# Patient Record
Sex: Female | Born: 1990
Health system: Southern US, Community
[De-identification: ages and names within clinical notes are randomized; demographics above are authoritative.]

## PROBLEM LIST (undated history)

## (undated) ENCOUNTER — Inpatient Hospital Stay (HOSPITAL_COMMUNITY): Payer: Self-pay

## (undated) DIAGNOSIS — E079 Disorder of thyroid, unspecified: Secondary | ICD-10-CM

## (undated) DIAGNOSIS — Z789 Other specified health status: Secondary | ICD-10-CM

## (undated) HISTORY — PX: NO PAST SURGERIES: SHX2092

## (undated) HISTORY — DX: Disorder of thyroid, unspecified: E07.9

---

## 2014-10-05 LAB — OB RESULTS CONSOLE GC/CHLAMYDIA
Chlamydia: NEGATIVE
Gonorrhea: NEGATIVE

## 2014-10-05 LAB — OB RESULTS CONSOLE RPR: RPR: NONREACTIVE

## 2014-10-05 LAB — OB RESULTS CONSOLE ABO/RH: RH Type: POSITIVE

## 2014-10-05 LAB — OB RESULTS CONSOLE ANTIBODY SCREEN: Antibody Screen: NEGATIVE

## 2014-10-05 LAB — OB RESULTS CONSOLE HIV ANTIBODY (ROUTINE TESTING): HIV: NONREACTIVE

## 2014-10-05 LAB — OB RESULTS CONSOLE RUBELLA ANTIBODY, IGM: Rubella: IMMUNE

## 2014-10-05 LAB — OB RESULTS CONSOLE HEPATITIS B SURFACE ANTIGEN: HEP B S AG: NEGATIVE

## 2014-11-03 ENCOUNTER — Inpatient Hospital Stay (HOSPITAL_COMMUNITY)
Admission: AD | Admit: 2014-11-03 | Payer: BC Managed Care – PPO | Source: Ambulatory Visit | Admitting: Obstetrics and Gynecology

## 2014-11-24 NOTE — L&D Delivery Note (Signed)
Delivery Note At 2:31 PM a viable female "Desiree Watson" was delivered via Vaginal, Spontaneous Delivery (Presentation: OA restituting to Left Occiput Anterior).  APGARS: 8, 9; weight pending.   Placenta status: Intact, Spontaneous.  Cord: 3 vessels with the following complications: None.  Cord pH: NA  Anesthesia: Local for repair Episiotomy: None Lacerations: 2nd degree Perineal Suture Repair: 3.0 vicryl and 3.0 SH Est. Blood Loss (mL): 234  Mom to postpartum.  Baby to Couplet care / Skin to Skin.  Mom plans to breast and bottlefeed.  Dr. Sallye OberKulwa present during pushing and for entire delivery.  Sherre ScarletWILLIAMS, KIMBERLY 04/15/2015, 3:25 PM   I was called in for concern of fetal status with pushing, with decelerations with pushing.  When I arrived baby at outlet,+ 3 station.  She proceeded to push and deliver spontaneously with good fetal tolerance of pushing.  Dr. Sallye OberKulwa.

## 2015-03-26 LAB — OB RESULTS CONSOLE GBS: GBS: NEGATIVE

## 2015-04-15 ENCOUNTER — Encounter (HOSPITAL_COMMUNITY): Payer: Self-pay

## 2015-04-15 ENCOUNTER — Inpatient Hospital Stay (HOSPITAL_COMMUNITY)
Admission: AD | Admit: 2015-04-15 | Discharge: 2015-04-17 | DRG: 774 | Disposition: A | Discharge status: Home / Self Care | Payer: BLUE CROSS/BLUE SHIELD | Source: Ambulatory Visit | Attending: Obstetrics & Gynecology | Admitting: Obstetrics & Gynecology

## 2015-04-15 DIAGNOSIS — O1493 Unspecified pre-eclampsia, third trimester: Principal | ICD-10-CM | POA: Diagnosis present

## 2015-04-15 DIAGNOSIS — O1213 Gestational proteinuria, third trimester: Secondary | ICD-10-CM | POA: Diagnosis present

## 2015-04-15 DIAGNOSIS — Z789 Other specified health status: Secondary | ICD-10-CM | POA: Diagnosis not present

## 2015-04-15 DIAGNOSIS — Z603 Acculturation difficulty: Secondary | ICD-10-CM | POA: Diagnosis not present

## 2015-04-15 DIAGNOSIS — R030 Elevated blood-pressure reading, without diagnosis of hypertension: Secondary | ICD-10-CM

## 2015-04-15 DIAGNOSIS — O1214 Gestational proteinuria, complicating childbirth: Secondary | ICD-10-CM | POA: Diagnosis present

## 2015-04-15 DIAGNOSIS — Z3403 Encounter for supervision of normal first pregnancy, third trimester: Secondary | ICD-10-CM | POA: Diagnosis present

## 2015-04-15 DIAGNOSIS — IMO0001 Reserved for inherently not codable concepts without codable children: Secondary | ICD-10-CM | POA: Diagnosis present

## 2015-04-15 DIAGNOSIS — Z3A38 38 weeks gestation of pregnancy: Secondary | ICD-10-CM | POA: Diagnosis present

## 2015-04-15 HISTORY — DX: Other specified health status: Z78.9

## 2015-04-15 LAB — URIC ACID: URIC ACID, SERUM: 5.4 mg/dL (ref 2.3–6.6)

## 2015-04-15 LAB — LACTATE DEHYDROGENASE: LDH: 169 U/L (ref 98–192)

## 2015-04-15 LAB — COMPREHENSIVE METABOLIC PANEL
ALT: 24 U/L (ref 14–54)
AST: 26 U/L (ref 15–41)
Albumin: 3.3 g/dL — ABNORMAL LOW (ref 3.5–5.0)
Alkaline Phosphatase: 210 U/L — ABNORMAL HIGH (ref 38–126)
Anion gap: 8 (ref 5–15)
BUN: 11 mg/dL (ref 6–20)
CALCIUM: 9.1 mg/dL (ref 8.9–10.3)
CO2: 25 mmol/L (ref 22–32)
CREATININE: 0.71 mg/dL (ref 0.44–1.00)
Chloride: 100 mmol/L — ABNORMAL LOW (ref 101–111)
GFR calc non Af Amer: >60 mL/min (ref >60)
Glucose, Bld: 80 mg/dL (ref 65–99)
Potassium: 4.3 mmol/L (ref 3.5–5.1)
Sodium: 133 mmol/L — ABNORMAL LOW (ref 135–145)
Total Bilirubin: 0.2 mg/dL — ABNORMAL LOW (ref 0.3–1.2)
Total Protein: 7.1 g/dL (ref 6.5–8.1)

## 2015-04-15 LAB — PROTEIN / CREATININE RATIO, URINE
CREATININE, URINE: 181 mg/dL
PROTEIN CREATININE RATIO: 0.55 mg/mg{creat} — AB (ref 0.00–0.15)
Total Protein, Urine: 100 mg/dL

## 2015-04-15 LAB — CBC
HEMATOCRIT: 40.7 % (ref 36.0–46.0)
Hemoglobin: 14.2 g/dL (ref 12.0–15.0)
MCH: 29 pg (ref 26.0–34.0)
MCHC: 34.9 g/dL (ref 30.0–36.0)
MCV: 83.1 fL (ref 78.0–100.0)
PLATELETS: 255 10*3/uL (ref 150–400)
RBC: 4.9 MIL/uL (ref 3.87–5.11)
RDW: 14.1 % (ref 11.5–15.5)
WBC: 11.9 10*3/uL — AB (ref 4.0–10.5)

## 2015-04-15 LAB — TYPE AND SCREEN
ABO/RH(D): B POS
ANTIBODY SCREEN: NEGATIVE

## 2015-04-15 LAB — ABO/RH: ABO/RH(D): B POS

## 2015-04-15 MED ORDER — LANOLIN HYDROUS EX OINT: TOPICAL_OINTMENT | CUTANEOUS | Status: DC | PRN

## 2015-04-15 MED ORDER — ONDANSETRON HCL 4 MG PO TABS: 4.0000 mg | ORAL_TABLET | ORAL | Status: DC | PRN

## 2015-04-15 MED ORDER — LACTATED RINGERS IV SOLN
500.0000 mL | INTRAVENOUS | Status: DC | PRN
  Administered 2015-04-15 (×2): 500 mL via INTRAVENOUS

## 2015-04-15 MED ORDER — BENZOCAINE-MENTHOL 20-0.5 % EX AERO
1.0000 "application " | INHALATION_SPRAY | CUTANEOUS | Status: DC | PRN
  Administered 2015-04-16 – 2015-04-17 (×2): 1 via TOPICAL
  Filled 2015-04-15 (×2): qty 56

## 2015-04-15 MED ORDER — FENTANYL 2.5 MCG/ML BUPIVACAINE 1/10 % EPIDURAL INFUSION (WH - ANES): 14.0000 mL/h | INTRAMUSCULAR | Status: DC | PRN

## 2015-04-15 MED ORDER — OXYTOCIN BOLUS FROM INFUSION
500.0000 mL | INTRAVENOUS | Status: DC
  Administered 2015-04-15: 500 mL via INTRAVENOUS

## 2015-04-15 MED ORDER — CITRIC ACID-SODIUM CITRATE 334-500 MG/5ML PO SOLN
30.0000 mL | ORAL | Status: DC | PRN
Start: 2015-04-15 — End: 2015-04-15

## 2015-04-15 MED ORDER — DIPHENHYDRAMINE HCL 25 MG PO CAPS: 25.0000 mg | ORAL_CAPSULE | Freq: Four times a day (QID) | ORAL | Status: DC | PRN

## 2015-04-15 MED ORDER — FLEET ENEMA 7-19 GM/118ML RE ENEM: 1.0000 | ENEMA | RECTAL | Status: DC | PRN

## 2015-04-15 MED ORDER — TERBUTALINE SULFATE 1 MG/ML IJ SOLN: 0.2500 mg | Freq: Once | INTRAMUSCULAR | Status: DC

## 2015-04-15 MED ORDER — ONDANSETRON HCL 4 MG/2ML IJ SOLN: 4.0000 mg | INTRAMUSCULAR | Status: DC | PRN

## 2015-04-15 MED ORDER — LIDOCAINE HCL (PF) 1 % IJ SOLN
30.0000 mL | INTRAMUSCULAR | Status: AC | PRN
  Administered 2015-04-15: 30 mL via SUBCUTANEOUS
  Filled 2015-04-15: qty 30

## 2015-04-15 MED ORDER — ACETAMINOPHEN 325 MG PO TABS: 650.0000 mg | ORAL_TABLET | ORAL | Status: DC | PRN

## 2015-04-15 MED ORDER — DIBUCAINE 1 % RE OINT: 1.0000 "application " | TOPICAL_OINTMENT | RECTAL | Status: DC | PRN

## 2015-04-15 MED ORDER — WITCH HAZEL-GLYCERIN EX PADS: 1.0000 "application " | MEDICATED_PAD | CUTANEOUS | Status: DC | PRN

## 2015-04-15 MED ORDER — SIMETHICONE 80 MG PO CHEW: 80.0000 mg | CHEWABLE_TABLET | ORAL | Status: DC | PRN

## 2015-04-15 MED ORDER — LACTATED RINGERS IV SOLN: INTRAVENOUS | Status: DC

## 2015-04-15 MED ORDER — PHENYLEPHRINE 40 MCG/ML (10ML) SYRINGE FOR IV PUSH (FOR BLOOD PRESSURE SUPPORT)
80.0000 ug | PREFILLED_SYRINGE | INTRAVENOUS | Status: DC | PRN
  Filled 2015-04-15: qty 2

## 2015-04-15 MED ORDER — OXYCODONE-ACETAMINOPHEN 5-325 MG PO TABS: 2.0000 | ORAL_TABLET | ORAL | Status: DC | PRN

## 2015-04-15 MED ORDER — HYDROXYZINE HCL 50 MG PO TABS: 50.0000 mg | ORAL_TABLET | Freq: Four times a day (QID) | ORAL | Status: DC | PRN

## 2015-04-15 MED ORDER — ZOLPIDEM TARTRATE 5 MG PO TABS: 5.0000 mg | ORAL_TABLET | Freq: Every evening | ORAL | Status: DC | PRN

## 2015-04-15 MED ORDER — EPHEDRINE 5 MG/ML INJ
10.0000 mg | INTRAVENOUS | Status: DC | PRN
  Filled 2015-04-15: qty 2

## 2015-04-15 MED ORDER — IBUPROFEN 600 MG PO TABS
600.0000 mg | ORAL_TABLET | Freq: Four times a day (QID) | ORAL | Status: DC
  Administered 2015-04-15 – 2015-04-17 (×8): 600 mg via ORAL
  Filled 2015-04-15 (×7): qty 1

## 2015-04-15 MED ORDER — OXYCODONE-ACETAMINOPHEN 5-325 MG PO TABS: 1.0000 | ORAL_TABLET | ORAL | Status: DC | PRN

## 2015-04-15 MED ORDER — TERBUTALINE SULFATE 1 MG/ML IJ SOLN
INTRAMUSCULAR | Status: AC
  Filled 2015-04-15: qty 1

## 2015-04-15 MED ORDER — OXYTOCIN 40 UNITS IN LACTATED RINGERS INFUSION - SIMPLE MED
62.5000 mL/h | INTRAVENOUS | Status: DC
  Filled 2015-04-15: qty 1000

## 2015-04-15 MED ORDER — ONDANSETRON HCL 4 MG/2ML IJ SOLN: 4.0000 mg | Freq: Four times a day (QID) | INTRAMUSCULAR | Status: DC | PRN

## 2015-04-15 MED ORDER — SENNOSIDES-DOCUSATE SODIUM 8.6-50 MG PO TABS
2.0000 | ORAL_TABLET | ORAL | Status: DC
  Administered 2015-04-15 – 2015-04-17 (×2): 2 via ORAL
  Filled 2015-04-15 (×2): qty 2

## 2015-04-15 MED ORDER — PRENATAL MULTIVITAMIN CH
1.0000 | ORAL_TABLET | Freq: Every day | ORAL | Status: DC
  Administered 2015-04-16: 1 via ORAL
  Filled 2015-04-15: qty 1

## 2015-04-15 MED ORDER — DIPHENHYDRAMINE HCL 50 MG/ML IJ SOLN: 12.5000 mg | INTRAMUSCULAR | Status: DC | PRN

## 2015-04-15 MED ORDER — TETANUS-DIPHTH-ACELL PERTUSSIS 5-2.5-18.5 LF-MCG/0.5 IM SUSP: 0.5000 mL | Freq: Once | INTRAMUSCULAR | Status: DC

## 2015-04-15 MED ORDER — NALBUPHINE HCL 10 MG/ML IJ SOLN
10.0000 mg | INTRAMUSCULAR | Status: DC | PRN
  Administered 2015-04-15: 10 mg via INTRAVENOUS
  Filled 2015-04-15: qty 1

## 2015-04-15 NOTE — Progress Notes (Signed)
Subjective: Denies h/a, visual disturbances, RUQ pain, CP, SOB, weakness or N/V. +FM. Involuntarily pushing.  Objective: BP 138/80 mmHg  Pulse 88  Temp(Src) 98 F (36.7 C) (Oral)  Resp 20  Ht 5' 1"  (1.549 m)  Wt 64.864 kg (143 lb)  BMI 27.03 kg/m2     Recent Results (from the past 2160 hour(s))  OB RESULT CONSOLE Group B Strep     Status: None   Collection Time: 03/26/15 12:00 AM  Result Value Ref Range   GBS Negative   CBC     Status: Abnormal   Collection Time: 04/15/15 10:50 AM  Result Value Ref Range   WBC 11.9 (H) 4.0 - 10.5 K/uL   RBC 4.90 3.87 - 5.11 MIL/uL   Hemoglobin 14.2 12.0 - 15.0 g/dL   HCT 40.7 36.0 - 46.0 %   MCV 83.1 78.0 - 100.0 fL   MCH 29.0 26.0 - 34.0 pg   MCHC 34.9 30.0 - 36.0 g/dL   RDW 14.1 11.5 - 15.5 %   Platelets 255 150 - 400 K/uL  Type and screen     Status: None   Collection Time: 04/15/15 10:50 AM  Result Value Ref Range   ABO/RH(D) B POS    Antibody Screen NEG    Sample Expiration 04/18/2015   Comprehensive metabolic panel     Status: Abnormal   Collection Time: 04/15/15 10:50 AM  Result Value Ref Range   Sodium 133 (L) 135 - 145 mmol/L   Potassium 4.3 3.5 - 5.1 mmol/L   Chloride 100 (L) 101 - 111 mmol/L   CO2 25 22 - 32 mmol/L   Glucose, Bld 80 65 - 99 mg/dL   BUN 11 6 - 20 mg/dL   Creatinine, Ser 0.71 0.44 - 1.00 mg/dL   Calcium 9.1 8.9 - 10.3 mg/dL   Total Protein 7.1 6.5 - 8.1 g/dL   Albumin 3.3 (L) 3.5 - 5.0 g/dL   AST 26 15 - 41 U/L   ALT 24 14 - 54 U/L   Alkaline Phosphatase 210 (H) 38 - 126 U/L   Total Bilirubin 0.2 (L) 0.3 - 1.2 mg/dL    Comment: REPEATED TO VERIFY   GFR calc non Af Amer >60 >60 mL/min   GFR calc Af Amer >60 >60 mL/min    Comment: (NOTE) The eGFR has been calculated using the CKD EPI equation. This calculation has not been validated in all clinical situations. eGFR's persistently <60 mL/min signify possible Chronic Kidney Disease.    Anion gap 8 5 - 15  Lactate dehydrogenase     Status: None   Collection Time: 04/15/15 10:50 AM  Result Value Ref Range   LDH 169 98 - 192 U/L  Uric acid     Status: None   Collection Time: 04/15/15 10:50 AM  Result Value Ref Range   Uric Acid, Serum 5.4 2.3 - 6.6 mg/dL  Protein / creatinine ratio, urine     Status: Abnormal   Collection Time: 04/15/15 12:50 PM  Result Value Ref Range   Creatinine, Urine 181.00 mg/dL   Total Protein, Urine 100 mg/dL    Comment: NO NORMAL RANGE ESTABLISHED FOR THIS TEST   Protein Creatinine Ratio 0.55 (H) 0.00 - 0.15 mg/mg[Cre]   FHT: Category 2  UC:   irregular, every 3-4 minutes SVE:   Dilation: 10 Effacement (%): 80 Station: +2 Exam by:: Nolita Kutter   Assessment:  IUP at 38.6 wks Preeclampsia w/o severe features 2nd stage labor  Plan: Consult  Farrel Gordon CNM 04/15/2015, 1:48 PM

## 2015-04-15 NOTE — Progress Notes (Signed)
Williams CNM on phone with her attending.

## 2015-04-15 NOTE — Progress Notes (Addendum)
FSE placed. Repetitive Variables, occ late with each push. Dr. Sallye OberKulwa notified and enroute to evaluate pt. Will cease pushing at this time. Discussed concerning FHRT with spouse and the need to hold off pushing until Dr. Sallye OberKulwa arrives. Spouse voiced understanding and relayed information to pt.  FHRT w/ moderate variability when pt is not pushing. Mod-severe variables w/ pushing. 02, position change and increase in IVFs in progress. Terb on stand-by.  Urine PCR .55; pt w/o s/s of preeclampsia. BPs range 123-146/80-88. Dr. Sallye OberKulwa made aware.   Sherre ScarletKimberly Tadeusz Stahl, CNM 04/15/15, 2:19 PM

## 2015-04-15 NOTE — Progress Notes (Signed)
  Subjective: Called to room to evaluate FHRT. S/P Nubain. Feels urge to push. Spouse at bedside serving as interpreter.  Objective: BP 142/88 mmHg  Pulse 78  Temp(Src) 98 F (36.7 C) (Oral)  Resp 16  Ht 5\' 1"  (1.549 m)  Wt 64.864 kg (143 lb)  BMI 27.03 kg/m2     Today's Vitals   04/15/15 1035 04/15/15 1119 04/15/15 1144 04/15/15 1212  BP: 142/88     Pulse: 78     Temp: 98 F (36.7 C)     TempSrc: Oral     Resp: 16     Height: 5\' 1"  (1.549 m)     Weight: 64.864 kg (143 lb)     PainSc: 6  Asleep Asleep Asleep   FHT: BL 130 w/ moderate variability, +accels, occ variables and lates to 60-90 w/ steady return to baseline, +earlys. UC:   irregular, every 2-3 minutes SVE: 8/80/0 - +1  BBOW - AROM'd, copious amount of clear fluid - cvx initially felt to be complete, but was able to feel (floppy) cvx on both sides. Trial push poor effort. Straight cath'd due to urinating during trial push. Sample taken for PCR.  Assessment:  IUP at 38.6 wks Active labor Cat 2 tracing that resolved to Cat 1 w/ intrauterine resuscitative measures  Plan: Continue intrauterine resuscitative measures prn Await full dilation before beginning active pushing Consult prn Expect SVD   Sherre ScarletWILLIAMS, Donyell Ding CNM 04/15/2015, 12:57 PM

## 2015-04-15 NOTE — Progress Notes (Signed)
Desiree Watson, CNM notified of pt in MAU for labor eval. RN to check pt's cervix and cb with results.

## 2015-04-15 NOTE — H&P (Signed)
Desiree Watson is a 24 y.o. female, G1P0 at 38.6 weeks, presenting for ctxs, q 6 min x 1 hour. +FM and bloody show. Denies headache, visual disturbances, RUQ pain, CP, SOB, weakness, N/V or leaking.   Patient Active Problem List   Diagnosis Date Noted  . Normal labor 04/15/2015  . Language barrier 04/15/2015  . Elevated BP 04/15/2015    History of present pregnancy: Patient entered care at 14.3 weeks.   EDC of 04/23/15 was established by sure LMP.   Anatomy scan: 18.0 weeks, with ACI and fetal spine not seen and an anterior placenta.   Additional US evaluations: None.   Significant prenatal events: Urinary frequency in 2nd trimester - neg urine culture; vaginal spotting around 22 wks - ceased on own; gas pain in 3rd trimester - resolved on own; abdominal pain when standing at work - common discomforts of pregnancy reviewed, belly band for support suggested, PTL precautions given. BP 130/90 at 34 wks w/ repeat = 110/80. No MAU visits. TWG 18 lbs.  Last evaluation: Office on 04/09/15 @ 38 wks by Dr. Stefano GaulStringer. FHR 150 bpm. Cvx 2/50/-3. BP 100/60.   OB History    Gravida Para Term Preterm AB TAB SAB Ectopic Multiple Living   1              Past Medical History  Diagnosis Date  . Medical history non-contributory    Past Surgical History  Procedure Laterality Date  . No past surgeries     Family History: family history is not on file. Social History:  reports that she has never smoked. She has never used smokeless tobacco. She reports that she does not drink alcohol or use illicit drugs.Pt is an Asian [place] female with a 12th grade education, and married to FlorenceLin K. She speaks a little [place] - spouse serves as Equities traderinterpreter. 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   Prenatal Transfer Tool  Maternal Diabetes: No Genetic Screening: Normal Maternal Ultrasounds/Referrals: Normal Fetal Ultrasounds or other Referrals:  None Maternal Substance Abuse:  No Significant Maternal  Medications:  Meds include: Other: PNVs Significant Maternal Lab Results: Lab values include: Group B Strep negative  TDAP: 01/15/15 Flu: Does not recall date  ROS: As noted in HPI  No Known Allergies    Blood pressure 142/88, pulse 78, temperature 98 F (36.7 C), temperature source Oral, resp. rate 16, height 5\' 1"  (1.549 m), weight 64.864 kg (143 lb).  Chest clear Heart RRR without murmur Abd gravid, NT, FH S<D Pelvic: 6/80/-2 w/ BBOW per MAU RN Cephalic by Leopolds and VE EFW: 6+4 Bishop score: 9 Ext: WNL  FHR: Category 1 UCs: q 5-7 min  Prenatal labs: ABO, Rh: --/--/B POS (05/22 1050) Antibody: PENDING (05/22 1050) Neg 10/05/14 Rubella:   Immune RPR: Nonreactive (11/12 0000)  HBsAg: Negative (11/12 0000)  HIV: Non-reactive (11/12 0000)  GBS: Negative (05/02 0000) Sickle cell/Hgb electrophoresis: NA Pap: Normal on 10/27/15 GC: 10/05/14 Chlamydia: 10/05/14 Genetic screenings: Neg Glucola: Normal at 59 Hgb 13.1 at NOB, 12.5 at 28 weeks  Results for orders placed or performed during the hospital encounter of 04/15/15 (from the past 24 hour(s))  CBC     Status: Abnormal   Collection Time: 04/15/15 10:50 AM  Result Value Ref Range   WBC 11.9 (H) 4.0 - 10.5 K/uL   RBC 4.90 3.87 - 5.11 MIL/uL   Hemoglobin 14.2 12.0 - 15.0 g/dL   HCT 11.940.7 14.736.0 - 82.946.0 %   MCV 83.1 78.0 -  100.0 fL   MCH 29.0 26.0 - 34.0 pg   MCHC 34.9 30.0 - 36.0 g/dL   RDW 16.1 09.6 - 04.5 %   Platelets 255 150 - 400 K/uL  Type and screen     Status: None (Preliminary result)   Collection Time: 04/15/15 10:50 AM  Result Value Ref Range   ABO/RH(D) B POS    Antibody Screen PENDING    Sample Expiration 04/18/2015   Comprehensive metabolic panel     Status: Abnormal   Collection Time: 04/15/15 10:50 AM  Result Value Ref Range   Sodium 133 (L) 135 - 145 mmol/L   Potassium 4.3 3.5 - 5.1 mmol/L   Chloride 100 (L) 101 - 111 mmol/L   CO2 25 22 - 32 mmol/L   Glucose, Bld 80 65 - 99 mg/dL   BUN 11  6 - 20 mg/dL   Creatinine, Ser 4.09 0.44 - 1.00 mg/dL   Calcium 9.1 8.9 - 81.1 mg/dL   Total Protein 7.1 6.5 - 8.1 g/dL   Albumin 3.3 (L) 3.5 - 5.0 g/dL   AST 26 15 - 41 U/L   ALT 24 14 - 54 U/L   Alkaline Phosphatase 210 (H) 38 - 126 U/L   Total Bilirubin 0.2 (L) 0.3 - 1.2 mg/dL   GFR calc non Af Amer >60 >60 mL/min   GFR calc Af Amer >60 >60 mL/min   Anion gap 8 5 - 15  Lactate dehydrogenase     Status: None   Collection Time: 04/15/15 10:50 AM  Result Value Ref Range   LDH 169 98 - 192 U/L  Uric acid     Status: None   Collection Time: 04/15/15 10:50 AM  Result Value Ref Range   Uric Acid, Serum 5.4 2.3 - 6.6 mg/dL   Assessment: IUP at 91.4 wks Active labor GBS neg Elevated BP on admission - r/o preeclampsia  Plan: Admit to [facility] [employer] Routine [facility] orders Preeclampsia labs to include PCR w/ admission labs Pain med/epidural prn Expectant management for now Expect progress and SVD   Robyne Askew, MS 04/15/2015, 12:08 PM

## 2015-04-15 NOTE — MAU Note (Signed)
Pt to go to room 167 via WC

## 2015-04-15 NOTE — MAU Note (Signed)
Here for contractions q6 minutes apart, with bloody show.

## 2015-04-15 NOTE — Lactation Note (Signed)
This note was copied from the chart of Desiree Watson. Lactation Consultation Note  Patient Name: Desiree Watson JXBJY'NToday's Date: 04/15/2015 Reason for consult: Initial assessment Baby 5 hours of life. Mom resting in bed and baby wrapped in crib. FOB has signed as mom's interpreter. Enc mom to hold baby STS and mom agreed. Demonstrated to FOB how to change a dirty diaper. Assisted mom with hand express, but only tiny amount of moisture present at breast. Discussed supply and demand with mom and the benefits of STS. Baby had a bottle a little earlier and is not cuing to nurse. FOB states baby will be having a bath soon. Enc mom to continue STS until bath, and then during STS after bath to attempt to latch. Enc parents to ask for help with latching at that time if baby cuing to nurse. Discussed feeding cues. Parents given LC brochure, aware of OP/BFSG, community resources, and [facility] after [facility].  Maternal Data Has patient been taught Hand Expression?: Yes Does the patient have breastfeeding experience prior to this delivery?: No  Feeding    LATCH Score/Interventions                      Lactation Tools Discussed/Used     Consult Status Consult Status: Follow-up Date: 04/16/15 Follow-up type: In-patient    Geralynn OchsWILLIARD, Berdie Malter 04/15/2015, 9:11 PM

## 2015-04-15 NOTE — Progress Notes (Signed)
KWilliams, CNM notified of pt's cervical exam, will place admission orders and [facility]

## 2015-04-16 LAB — RPR
RPR: NONREACTIVE
RPR: NONREACTIVE

## 2015-04-16 LAB — CBC
HEMATOCRIT: 37.9 % (ref 36.0–46.0)
Hemoglobin: 13.2 g/dL (ref 12.0–15.0)
MCH: 28.7 pg (ref 26.0–34.0)
MCHC: 34.8 g/dL (ref 30.0–36.0)
MCV: 82.4 fL (ref 78.0–100.0)
Platelets: 218 10*3/uL (ref 150–400)
RBC: 4.6 MIL/uL (ref 3.87–5.11)
RDW: 14.1 % (ref 11.5–15.5)
WBC: 13.4 10*3/uL — ABNORMAL HIGH (ref 4.0–10.5)

## 2015-04-16 NOTE — Lactation Note (Addendum)
This note was copied from the chart of Desiree Vivianne Masterhuy Bissette. Lactation Consultation Note  Patient Name: Desiree Watson ZOXWR'UToday's Date: 04/16/2015 Reason for consult: Follow-up assessment;Breast/nipple pain;Difficult latch FOB interprets for Mom. Parents report baby is not latching to right breast and Mom left nipple is now tender due to baby nursing on this side. The left nipple is red, demonstrated to Mom how to hand express and apply EBM to sore nipple. Mom's nipples are erect but with short nipple shafts and aerola edema present bilateral. Baby is noted to have short lingual frenulum. Mom is trying to latch baby in cradle hold on left breast but baby is not obtaining good depth. Assisted Mom is cross cradle and football hold. Baby latches in football hold with the most depth. Mom prefers cradle. Gave Mom hand pump and reviewed how to use/clean with parents. Encouraged to pre-pump to help with latch. Advised baby should be at the breast 8-12 times in 24 hours nursing 15-20 minutes, both breasts each feeding when possible. Encouraged Mom to change positions with nursing to help with tenderness. Advised if parents continue to supplement to BF with each feeding before giving any supplements. Supplemental guidelines reviewed with FOB. If baby does not latch to right breast, advised Mom needs to pump for 15 minutes to encourage milk production.  Encouraged to call for assist as needed.   Maternal Data    Feeding Feeding Type: Breast Fed Length of feed: 15 min  LATCH Score/Interventions Latch: Repeated attempts needed to sustain latch, nipple held in mouth throughout feeding, stimulation needed to elicit sucking reflex. Intervention(s): Adjust position;Assist with latch;Breast massage;Breast compression  Audible Swallowing: A few with stimulation  Type of Nipple: Everted at rest and after stimulation (short nipple shafts bilateral, aerola edema)  Comfort (Breast/Nipple): Filling, red/small blisters or  bruises, mild/mod discomfort  Problem noted: Mild/Moderate discomfort (left nipple) Interventions (Mild/moderate discomfort): Hand massage;Hand expression  Hold (Positioning): Assistance needed to correctly position infant at breast and maintain latch. Intervention(s): Breastfeeding basics reviewed;Support Pillows;Position options;Skin to skin  LATCH Score: 6  Lactation Tools Discussed/Used Tools: Pump Breast pump type: Manual   Consult Status Consult Status: Follow-up Date: 04/17/15 Follow-up type: In-patient    Alfred LevinsGranger, Latravion Graves Ann 04/16/2015, 4:21 PM

## 2015-04-16 NOTE — Progress Notes (Signed)
Post Partum Day 1 Subjective: no complaints, up ad lib and tolerating PO  Objective: Blood pressure 114/76, pulse 74, temperature 98.6 F (37 C), temperature source Oral, resp. rate 18, height 5\' 1"  (1.549 m), weight 143 lb (64.864 kg), unknown if currently breastfeeding.  Physical Exam:  General: alert and cooperative Lochia: appropriate Uterine Fundus: firm Incision: na DVT Evaluation: No evidence of DVT seen on physical exam.   Recent Labs  04/15/15 1050 04/16/15 0545  HGB 14.2 13.2  HCT 40.7 37.9    Assessment/Plan: Plan for discharge tomorrow   LOS: 1 day   Jaloni Sorber A 04/16/2015, 11:03 AM

## 2015-04-17 MED ORDER — IBUPROFEN 600 MG PO TABS: 600.0000 mg | ORAL_TABLET | Freq: Four times a day (QID) | ORAL | Status: DC | PRN

## 2015-04-17 NOTE — Discharge Instructions (Signed)

## 2015-04-17 NOTE — Lactation Note (Signed)
This note was copied from the chart of Desiree Vivianne Masterhuy Ballman. Lactation Consultation  Baby latched easily on left breast. Sucks and some swallows observed with breast massage. Assisted mother with positioning for increased depth. Observed feeding on left for 15 min. Burped baby and mother latched baby on right breast.  Sucks and some swallows observed. Reviewed engorgement care and Baby & Me booklet pg 24 for monitoring voids/stools. Discussed using hand pump if baby has difficulty latching. Reminded parents to feed baby every 3 hours or sooner based on cues.  Patient Name: Desiree Watson ZOXWR'UToday's Date: 04/17/2015 Reason for consult: Follow-up assessment   Maternal Data    Feeding Feeding Type: Breast Fed  LATCH Score/Interventions Latch: Grasps breast easily, tongue down, lips flanged, rhythmical sucking. Intervention(s): Breast massage;Assist with latch;Adjust position  Audible Swallowing: A few with stimulation Intervention(s): Hand expression Intervention(s): Alternate breast massage  Type of Nipple: Everted at rest and after stimulation  Comfort (Breast/Nipple): Filling, red/small blisters or bruises, mild/mod discomfort  Problem noted: Mild/Moderate discomfort  Hold (Positioning): No assistance needed to correctly position infant at breast.  LATCH Score: 8  Lactation Tools Discussed/Used     Consult Status Consult Status: Complete    Hardie PulleyBerkelhammer, Ruth Boschen 04/17/2015, 9:50 AM

## 2015-04-17 NOTE — Discharge Summary (Signed)
  Vaginal Delivery Discharge Summary  Desiree Watson  DOB:    05/23/1991 MRN:    [id number] CSN:    [id number]  Date of admission:                  04/15/15  Date of discharge:                   04/17/15  Procedures this admission:   SVB, repair of 2nd degree perineal laceration  Date of Delivery: 04/17/15  Newborn Data:  Live born female  Birth Weight: 6 lb 5.8 oz (2885 g) APGAR: 8, 9  Home with mother. Name: Desiree Watson  History of Present Illness:  Desiree Watson is a 24 y.o. female, G1P1001, who presents at 1186w6d weeks gestation. [facility] She was admitted for onset of labor. Her pregnancy has been complicated by:  Patient Active Problem List   Diagnosis Date Noted  . Language barrier 04/15/2015  . Elevated BP 04/15/2015  . Proteinuria in pregnancy, delivered 04/15/2015  . Vaginal delivery 04/15/2015  . Second-degree perineal laceration, with delivery 04/15/2015     Hospital Course:  Admitted 5/34/16 in active labor, with mild elevation of BP. Negative GBS. Progressed quickly, with AROM. Utilized nothing for pain management.  Delivery was performed by Sherre ScarletKimberly Williams, CNM, without complication. Patient and baby tolerated the procedure without difficulty, with 2nd degree laceration noted. Infant status was stable and remained in room with mother.  Mother and infant then had an uncomplicated postpartum course, with breast and bottle feeding going well. Mom's physical exam was WNL, and she was discharged home in stable condition. Contraception plan was undecided at the time of d/c.  She received adequate benefit from po pain medications, only using Motrin, with benefit..   Feeding:  breast and bottle  Contraception:  Undecided  Hemoglobin Results:  CBC Latest Ref Rng 04/16/2015 04/15/2015  WBC 4.0 - 10.5 K/uL 13.4(H) 11.9(H)  Hemoglobin 12.0 - 15.0 g/dL 78.213.2 95.614.2   Hematocrit 36.0 - 46.0 % 37.9 40.7  Platelets 150 - 400 K/uL 218 255     Discharge Physical Exam:   General: alert Lochia: appropriate Uterine Fundus: firm Incision: healing well DVT Evaluation: No evidence of DVT seen on physical exam. Negative Homan's sign.  Intrapartum Procedures: spontaneous vaginal delivery Postpartum Procedures: none Complications-Operative and Postpartum: 2nd degree perineal laceration  Discharge Diagnoses: Term Pregnancy-delivered  Discharge Information:  Activity:           pelvic rest Diet:                routine Medications: Ibuprofen Condition:      stable Instructions:     Discharge to: home  Follow-up Information    [facility]. Schedule an appointment as soon as possible for a visit in 6 weeks.   Specialty:  Obstetrics and Gynecology   Why:  Call for any questions or concerns.   Contact information:   [address] [place] [phone]       Nigel BridgemanLATHAM, Maze Corniel CNM 04/17/2015 9:08 AM

## 2017-03-12 ENCOUNTER — Ambulatory Visit (INDEPENDENT_AMBULATORY_CARE_PROVIDER_SITE_OTHER): Payer: BLUE CROSS/BLUE SHIELD | Admitting: Physician Assistant

## 2017-03-12 VITALS — BP 133/83 | HR 82 | Temp 99.2°F | Resp 16 | Ht 61.0 in | Wt 121.8 lb

## 2017-03-12 DIAGNOSIS — E049 Nontoxic goiter, unspecified: Secondary | ICD-10-CM

## 2017-03-12 DIAGNOSIS — Z8639 Personal history of other endocrine, nutritional and metabolic disease: Secondary | ICD-10-CM | POA: Diagnosis not present

## 2017-03-12 NOTE — Patient Instructions (Addendum)
You will get a call to schedule an appt with Dr. Dwyane Dee.  I will call you with the results of today's lab work when it comes back.   Thank you for coming in today. I hope you feel we met your needs.  Feel free to call [facility] if you have any questions or further requests.  Please consider signing up for MyChart if you do not already have it, as this is a great way to communicate with me.  Best,  Whitney McVey, PA-C   IF you received an x-ray today, you will receive an invoice from [facility]. Please contact [facility] at [phone] with questions or concerns regarding your invoice.   IF you received labwork today, you will receive an invoice from [facility]. Please contact [facility] at [phone] with questions or concerns regarding your invoice.   Our billing staff will not be able to assist you with questions regarding bills from these companies.  You will be contacted with the lab results as soon as they are available. The fastest way to get your results is to activate your My Chart account. Instructions are located on the last page of this paperwork. If you have not heard from [place] regarding the results in 2 weeks, please contact this office.

## 2017-03-12 NOTE — Progress Notes (Signed)
   Desiree Watson  MRN: [id number] DOB: 13-Nov-1991  PCP: No PCP Per Patient  Subjective:  Pt is a 26 year old female who presents to clinic for thyroid problem. She speaks vietnamese. She is here today with her father-in-law who is interpreting for her today.  She was diagnosed with a thyroid problem in [place] in 2010. Her last appt was 2014.   No mediations since 2014. She cannot recall the name of the medication she was taking.   C/o increasing size of her thyroid over the past month.  Denies heat/cold intolerance, anxiousness, change in bowel habits, hair loss, skin changes.    Review of Systems  Constitutional: Negative for activity change, appetite change and unexpected weight change.  Cardiovascular: Negative for chest pain and palpitations.  Endocrine: Negative for cold intolerance and heat intolerance.  Skin: Negative.   Neurological: Negative for dizziness and light-headedness.    Patient Active Problem List   Diagnosis Date Noted  . Language barrier 04/15/2015  . Elevated BP 04/15/2015  . Proteinuria in pregnancy, delivered 04/15/2015  . Vaginal delivery 04/15/2015  . Second-degree perineal laceration, with delivery 04/15/2015    Current Outpatient Prescriptions on File Prior to Visit  Medication Sig Dispense Refill  . ibuprofen (ADVIL,MOTRIN) 600 MG tablet Take 1 tablet (600 mg total) by mouth every 6 (six) hours as needed. (Patient not taking: Reported on 03/12/2017) 30 tablet 2  . Prenatal Vit-Fe Fumarate-FA (PRENATAL MULTIVITAMIN) TABS tablet Take 1 tablet by mouth daily at 12 noon.     No current facility-administered medications on file prior to visit.     No Known Allergies   Objective:  BP 133/83   Pulse 82   Temp 99.2 F (37.3 C) (Oral)   Resp 16   Ht  (1.549 m)   Wt 121 lb 12.8 oz (55.2 kg)   SpO2 99%   BMI 23.01 kg/m   Physical Exam  Constitutional: She is oriented to person, place, and time and well-developed, well-nourished, and in no distress.  No distress.  Neck: Neck supple. Thyromegaly present. No thyroid mass present.  Cardiovascular: Normal rate, regular rhythm and normal heart sounds.   Neurological: She is alert and oriented to person, place, and time. GCS score is 15.  Skin: Skin is warm and dry.  Psychiatric: Mood, memory, affect and judgment normal.  Vitals reviewed.   Assessment and Plan :  1. History of thyroid disorder 2. Enlarged thyroid - Ambulatory referral to Endocrinology - Thyroid Panel With TSH - Labs are pending, will contact with results. Referral to endocrinology for enlarged thyroid.   Marco Collie, PA-C  Primary Care at [facility] 03/12/2017 11:53 AM

## 2017-03-13 ENCOUNTER — Encounter: Payer: Self-pay | Admitting: Physician Assistant

## 2017-03-13 LAB — THYROID PANEL WITH TSH
Free Thyroxine Index: 1.9 (ref 1.2–4.9)
T3 Uptake Ratio: 31 % (ref 24–39)
T4, Total: 6.2 ug/dL (ref 4.5–12.0)
TSH: 0.876 u[IU]/mL (ref 0.450–4.500)

## 2017-04-24 ENCOUNTER — Encounter: Payer: Self-pay | Admitting: Endocrinology

## 2017-04-24 ENCOUNTER — Ambulatory Visit (INDEPENDENT_AMBULATORY_CARE_PROVIDER_SITE_OTHER): Payer: BLUE CROSS/BLUE SHIELD | Admitting: Endocrinology

## 2017-04-24 VITALS — BP 124/76 | HR 86 | Ht 61.0 in | Wt 120.2 lb

## 2017-04-24 DIAGNOSIS — E049 Nontoxic goiter, unspecified: Secondary | ICD-10-CM

## 2017-04-24 NOTE — Progress Notes (Signed)
Patient ID: Desiree Watson, female   DOB: 05/20/1991, 26 y.o.   MRN: [id number]            Referring physician: Sebastian AcheMcVey, Elizabeth Whitney, PA   Reason for Appointment: Goiter, new consultation    History of Present Illness:    The patient's thyroid enlargement was first discovered in 2011 approximately, at that time she was having a sensation of something in her throat when swallowing She was apparently in [place] at that time and was seen by a local physician She does not know what diagnosis was made.  However she was given an unknown medication which she took for 3 years With this her local symptoms resolved and she was told to stop the medication after taking it for 3 years  She is now being seen in consultation as she thinks she has the same symptoms again for a couple of months  She has had no difficulty with swallowing, however she has a feeling of something stuck in her throat area when swallowing although it is not uncomfortable Does not feel like she has any choking sensation in her neck or pressure in any position or when lying down. She has not noticed any swelling in her neck  Lab Results  Component Value Date   TSH 0.876 03/12/2017      Allergies as of 04/24/2017   No Known Allergies     Medication List    as of 04/24/2017 11:59 PM   You have not been prescribed any medications.     Allergies: No Known Allergies  Past Medical History:  Diagnosis Date  . Medical history non-contributory   . Thyroid disease     Past Surgical History:  Procedure Laterality Date  . NO PAST SURGERIES      Family History  Problem Relation Age of Onset  . Thyroid disease Neg Hx   . Cancer Neg Hx   . Diabetes Neg Hx     Social History:  reports that she has never smoked. She has never used smokeless tobacco. She reports that she does not drink alcohol or use drugs.    Review of Systems  Constitutional: Negative for weight loss and weight gain.  HENT: Negative for  hoarseness.   Eyes: Negative for visual disturbance.  Cardiovascular: Negative for leg swelling.  Gastrointestinal: Negative for constipation and diarrhea.  Endocrine: Negative for menstrual changes and fatigue.  Skin: Negative for rash.  Neurological: Negative for tremors.      Examination:   BP 124/76   Pulse 86   Ht 5\' 1"  (1.549 m)   Wt 120 lb 3.2 oz (54.5 kg)   SpO2 99%   BMI 22.71 kg/m    General Appearance: pleasant,  Averagely built and nourished         Eyes: No abnormal prominence or eyelid swelling.          Neck: The thyroid is enlarged Bilaterally, somewhat more on the right side especially medially.  Thyroid is about 2-2.5 times normal on the right and about twice normal on the left There is no stridor. There is no lymphadenopathy.     Cardiovascular: Normal  heart sounds, no murmur Respiratory:  Lungs clear Neurological: REFLEXES: at biceps are normal.  Skin: no rash        Assessment/Plan:  Long-standing goiter, may be either related to Hashimoto's thyroiditis or simple goiter Recently has normal thyroid levels However she thinks she has a foreign body sensation when swallowing because of the thyroid  swelling and this had previously improved with most likely thyroid supplementation Her symptom is likely to be related to her goiter recurring  Will further evaluate the goiter with TPO antibody and if positive will likely have good response to the thyroid suppression If negative we will consider thyroid ultrasound  Consultation note has been sent to the referring PCP  [facility] 04/26/2017

## 2017-04-27 ENCOUNTER — Other Ambulatory Visit (INDEPENDENT_AMBULATORY_CARE_PROVIDER_SITE_OTHER): Payer: BLUE CROSS/BLUE SHIELD

## 2017-04-27 DIAGNOSIS — E049 Nontoxic goiter, unspecified: Secondary | ICD-10-CM

## 2017-04-27 LAB — T4, FREE: Free T4: 0.9 ng/dL (ref 0.60–1.60)

## 2017-04-27 LAB — TSH: TSH: 0.69 u[IU]/mL (ref 0.35–4.50)

## 2017-04-28 ENCOUNTER — Telehealth: Payer: Self-pay | Admitting: Endocrinology

## 2017-04-28 LAB — THYROID PEROXIDASE ANTIBODY: THYROID PEROXIDASE ANTIBODY: 18 [IU]/mL (ref 0–34)

## 2017-04-28 NOTE — Telephone Encounter (Signed)
Called patients father in law back and went over lab results with him.

## 2017-04-28 NOTE — Progress Notes (Signed)
Please call her father-in-law about antibody test being negative, recommend thyroid ultrasound if agreeable with them

## 2017-04-28 NOTE — Telephone Encounter (Signed)
Patient's father in law returning phone call about lab results. Please call back. Okay to leave a detailed message [phone].

## 2017-04-29 ENCOUNTER — Other Ambulatory Visit: Payer: Self-pay | Admitting: Endocrinology

## 2017-04-29 DIAGNOSIS — E049 Nontoxic goiter, unspecified: Secondary | ICD-10-CM

## 2017-05-06 ENCOUNTER — Ambulatory Visit
Admission: RE | Admit: 2017-05-06 | Discharge: 2017-05-06 | Disposition: A | Discharge status: Home / Self Care | Payer: BLUE CROSS/BLUE SHIELD | Source: Ambulatory Visit | Attending: Endocrinology | Admitting: Endocrinology

## 2017-05-06 DIAGNOSIS — E049 Nontoxic goiter, unspecified: Secondary | ICD-10-CM | POA: Diagnosis not present

## 2017-05-06 NOTE — Progress Notes (Signed)
Please call to let her father-in-law note that she has small benign looking nodules in the thyroid and no further evaluation needed

## 2017-06-22 ENCOUNTER — Other Ambulatory Visit (INDEPENDENT_AMBULATORY_CARE_PROVIDER_SITE_OTHER): Payer: BLUE CROSS/BLUE SHIELD

## 2017-06-22 ENCOUNTER — Other Ambulatory Visit: Payer: Self-pay | Admitting: Endocrinology

## 2017-06-22 DIAGNOSIS — E049 Nontoxic goiter, unspecified: Secondary | ICD-10-CM | POA: Diagnosis not present

## 2017-06-22 DIAGNOSIS — E0590 Thyrotoxicosis, unspecified without thyrotoxic crisis or storm: Secondary | ICD-10-CM

## 2017-06-22 LAB — TSH: TSH: 0.01 u[IU]/mL — AB (ref 0.35–4.50)

## 2017-06-22 LAB — T3, FREE: T3 FREE: 7.6 pg/mL — AB (ref 2.3–4.2)

## 2017-06-22 LAB — T4, FREE: FREE T4: 3.62 ng/dL — AB (ref 0.60–1.60)

## 2017-06-25 ENCOUNTER — Encounter: Payer: Self-pay | Admitting: Endocrinology

## 2017-06-25 ENCOUNTER — Ambulatory Visit (INDEPENDENT_AMBULATORY_CARE_PROVIDER_SITE_OTHER): Payer: BLUE CROSS/BLUE SHIELD | Admitting: Endocrinology

## 2017-06-25 VITALS — BP 118/82 | HR 101 | Ht 61.0 in | Wt 118.0 lb

## 2017-06-25 DIAGNOSIS — E0590 Thyrotoxicosis, unspecified without thyrotoxic crisis or storm: Secondary | ICD-10-CM | POA: Diagnosis not present

## 2017-06-25 DIAGNOSIS — O99281 Endocrine, nutritional and metabolic diseases complicating pregnancy, first trimester: Secondary | ICD-10-CM | POA: Diagnosis not present

## 2017-06-25 MED ORDER — PROPYLTHIOURACIL 50 MG PO TABS: 100.0000 mg | ORAL_TABLET | Freq: Three times a day (TID) | ORAL | 1 refills | Status: DC

## 2017-06-25 NOTE — Progress Notes (Signed)
Patient ID: Desiree Watson, female   DOB: 01/27/1991, 26 y.o.   MRN: [id number]              Reason for Appointment: Follow-up of thyroid    History of Present Illness:    History obtained on initial consultation in June: The patient's thyroid enlargement was first discovered in 2011 approximately, at that time she was having a sensation of something in her throat when swallowing She was apparently in [place] at that time and was seen by a local physician She does not know what diagnosis was made.  However she was given an unknown medication which she took for 3 years With this her local symptoms resolved and she was told to stop the medication after taking it for 3 years She has had no difficulty with swallowing, however she has a feeling of something stuck in her throat area when swallowing although it is not uncomfortable  RECENT history:  It is unclear why the patient made a follow-up appointment and she had findings of euthyroid goiter with insignificant nodules on initial evaluation and no evidence of Hashimoto's thyroiditis  However she is about [redacted] weeks pregnant, her last menstrual cycle was on June 22 She has noticed some swelling of her thyroid area  On questioning the patient also said that she is having more tiredness and some general weakness She does have some morning sickness with nausea and occasional vomiting but not everyday and not consistent She thinks her stools are somewhat loose Does not complain of any shakiness or palpitations She feels a little warm on the back of her neck only and not generalized heat intolerance or sweating Her weight is down 2 pounds which she has a normal appetite when she does not have nausea  Wt Readings from Last 3 Encounters:  06/25/17 118 lb (53.5 kg)  04/24/17 120 lb 3.2 oz (54.5 kg)  03/12/17 121 lb 12.8 oz (55.2 kg)    Lab Results  Component Value Date   FREET4 3.62 (H) 06/22/2017   FREET4 0.90 04/27/2017   TSH 0.01 (L)  06/22/2017   TSH 0.69 04/27/2017   TSH 0.876 03/12/2017      Allergies as of 06/25/2017   No Known Allergies     Medication List    as of 06/25/2017 10:49 AM   You have not been prescribed any medications.     Allergies: No Known Allergies  Past Medical History:  Diagnosis Date  . Medical history non-contributory   . Thyroid disease     Past Surgical History:  Procedure Laterality Date  . NO PAST SURGERIES      Family History  Problem Relation Age of Onset  . Thyroid disease Neg Hx   . Cancer Neg Hx   . Diabetes Neg Hx     Social History:  reports that she has never smoked. She has never used smokeless tobacco. She reports that she does not drink alcohol or use drugs.    Review of Systems  Respiratory: Negative for shortness of breath.   Cardiovascular: Negative for palpitations.  Endocrine: Positive for fatigue and heat intolerance.  Neurological: Negative for tremors.      Examination:   BP 118/82   Pulse (!) 101   Ht 5\' 1"  (1.549 m)   Wt 118 lb (53.5 kg)   SpO2 97%   BMI 22.30 kg/m    Eyes: No abnormal prominence or eyelid swelling.           Neck: The  thyroid is enlarged diffusely Right lobe is enlarged about 2-1/2 times normal, she also has a fairly prominent isthmus Left lobe enlarged about twice normal Appears to have mild thrill over the thyroid   Cardiovascular: Normal  heart sounds, no murmur Neurological: She has no significant tremor REFLEXES: at biceps are slightly brisk.  No peripheral edema  Assessment/Plan:  HYPERTHYROIDISM associated with early pregnancy Her thyroid levels have increased very significantly within 2 months coinciding with her pregnancy Currently however she does not appear to have any hyperemesis associated with her pregnancy She is symptomatic and has tachycardia and appears to have some enlargement of her thyroid compared to her previous visit Her communication was done through her father-in-law but not  clear if she is understanding all her questions  Most likely her fatigue and generalized weakness is related to her hyperthyroidism  Since her thyroid levels including free T3 are markedly increased she very likely has Graves' disease  Will confirm this with thyrotroponin receptor antibody Mean values will start her on a modest dose of 100 mg 3 times a day of PTU Discussed that control of hyperthyroidism is important and we can target her thyroid levels to the upper normal range We will try to use the minimum dose of PTU which should otherwise he will be safe during pregnancy in the first trimester She will follow-up in one month  Encourage her to establish care with her obstetrician as soon as possible  Courtland Coppa 06/25/2017  Counseling time on subjects discussed in assessment and plan sections is over 50% of today's 25 minute visit

## 2017-06-26 LAB — THYROTROPIN RECEPTOR AUTOABS: THYROTROPIN RECEPTOR AB: 0.65 IU/L (ref 0.00–1.75)

## 2017-07-06 DIAGNOSIS — Z3492 Encounter for supervision of normal pregnancy, unspecified, second trimester: Secondary | ICD-10-CM | POA: Diagnosis not present

## 2017-07-06 DIAGNOSIS — Z3A12 12 weeks gestation of pregnancy: Secondary | ICD-10-CM | POA: Diagnosis not present

## 2017-07-06 DIAGNOSIS — O3680X0 Pregnancy with inconclusive fetal viability, not applicable or unspecified: Secondary | ICD-10-CM | POA: Diagnosis not present

## 2017-07-06 DIAGNOSIS — O30049 Twin pregnancy, dichorionic/diamniotic, unspecified trimester: Secondary | ICD-10-CM | POA: Diagnosis not present

## 2017-07-06 DIAGNOSIS — N912 Amenorrhea, unspecified: Secondary | ICD-10-CM | POA: Diagnosis not present

## 2017-07-17 DIAGNOSIS — Z3400 Encounter for supervision of normal first pregnancy, unspecified trimester: Secondary | ICD-10-CM | POA: Diagnosis not present

## 2017-07-17 DIAGNOSIS — Z3491 Encounter for supervision of normal pregnancy, unspecified, first trimester: Secondary | ICD-10-CM | POA: Diagnosis not present

## 2017-07-17 DIAGNOSIS — Z3A11 11 weeks gestation of pregnancy: Secondary | ICD-10-CM | POA: Diagnosis not present

## 2017-07-17 LAB — OB RESULTS CONSOLE GC/CHLAMYDIA
CHLAMYDIA, DNA PROBE: NEGATIVE
GC PROBE AMP, GENITAL: NEGATIVE

## 2017-07-17 LAB — OB RESULTS CONSOLE HIV ANTIBODY (ROUTINE TESTING): HIV: NONREACTIVE

## 2017-07-17 LAB — OB RESULTS CONSOLE ABO/RH: RH Type: POSITIVE

## 2017-07-17 LAB — OB RESULTS CONSOLE RPR: RPR: NONREACTIVE

## 2017-07-17 LAB — OB RESULTS CONSOLE RUBELLA ANTIBODY, IGM: Rubella: IMMUNE

## 2017-07-17 LAB — OB RESULTS CONSOLE HEPATITIS B SURFACE ANTIGEN: Hepatitis B Surface Ag: NEGATIVE

## 2017-07-17 LAB — OB RESULTS CONSOLE ANTIBODY SCREEN: Antibody Screen: NEGATIVE

## 2017-07-30 DIAGNOSIS — O30002 Twin pregnancy, unspecified number of placenta and unspecified number of amniotic sacs, second trimester: Secondary | ICD-10-CM | POA: Diagnosis not present

## 2017-07-30 DIAGNOSIS — N912 Amenorrhea, unspecified: Secondary | ICD-10-CM | POA: Diagnosis not present

## 2017-07-30 DIAGNOSIS — Z124 Encounter for screening for malignant neoplasm of cervix: Secondary | ICD-10-CM | POA: Diagnosis not present

## 2017-07-30 DIAGNOSIS — Z3A15 15 weeks gestation of pregnancy: Secondary | ICD-10-CM | POA: Diagnosis not present

## 2017-07-30 DIAGNOSIS — O3680X0 Pregnancy with inconclusive fetal viability, not applicable or unspecified: Secondary | ICD-10-CM | POA: Diagnosis not present

## 2017-08-03 ENCOUNTER — Other Ambulatory Visit: Payer: BLUE CROSS/BLUE SHIELD

## 2017-08-03 ENCOUNTER — Other Ambulatory Visit (INDEPENDENT_AMBULATORY_CARE_PROVIDER_SITE_OTHER): Payer: BLUE CROSS/BLUE SHIELD

## 2017-08-03 DIAGNOSIS — E0590 Thyrotoxicosis, unspecified without thyrotoxic crisis or storm: Secondary | ICD-10-CM

## 2017-08-03 DIAGNOSIS — O99281 Endocrine, nutritional and metabolic diseases complicating pregnancy, first trimester: Secondary | ICD-10-CM | POA: Diagnosis not present

## 2017-08-03 LAB — T3, FREE: T3, Free: 3.1 pg/mL (ref 2.3–4.2)

## 2017-08-03 LAB — T4, FREE: FREE T4: 0.58 ng/dL — AB (ref 0.60–1.60)

## 2017-08-05 ENCOUNTER — Ambulatory Visit (INDEPENDENT_AMBULATORY_CARE_PROVIDER_SITE_OTHER): Payer: BLUE CROSS/BLUE SHIELD | Admitting: Endocrinology

## 2017-08-05 ENCOUNTER — Encounter: Payer: Self-pay | Admitting: Endocrinology

## 2017-08-05 VITALS — BP 112/74 | HR 98 | Ht 61.0 in | Wt 128.4 lb

## 2017-08-05 DIAGNOSIS — E069 Thyroiditis, unspecified: Secondary | ICD-10-CM

## 2017-08-05 NOTE — Patient Instructions (Signed)
Stop PTU ?

## 2017-08-05 NOTE — Progress Notes (Signed)
Patient ID: Desiree Watson, female   DOB: 11/13/1991, 26 y.o.   MRN: [id number]              Reason for Appointment: Follow-up of thyroid    History of Present Illness:    History obtained on initial consultation in June: The patient's thyroid enlargement was first discovered in 2011 approximately, at that time she was having a sensation of something in her throat when swallowing She was apparently in [place] at that time and was seen by a local physician She does not know what diagnosis was made.  However she was given an unknown medication which she took for 3 years With this her local symptoms resolved and she was told to stop the medication after taking it for 3 years She has had no difficulty with swallowing, however she has a feeling of something stuck in her throat area when swallowing although it is not uncomfortable  RECENT history:  After initial consultation in 6/18 she had findings of euthyroid goiter with insignificant nodules on initial evaluation and no evidence of Hashimoto's thyroiditis She was seen in early pregnancy in 8/18 for follow-up of her thyroid when she was complaining of a little more fatigue and general weakness but also was having morning sickness However she did not have hyperemesis She did not have any typical symptoms of hyperthyroidism with shakiness, palpitations, heat intolerance or significant weight loss  Because of her significant hyperthyroidism and free T4 of 3.6 she was started on PTU 50 mg 3 times a day Thyrotropin antibody was negative  More recently she does not feel any fatigue or weakness and overall is eating better, has gained 10 pounds She is reportedly about 4 months pregnant and is due to deliver in 12/2017  Wt Readings from Last 3 Encounters:  08/05/17 128 lb 6.4 oz (58.2 kg)  06/25/17 118 lb (53.5 kg)  04/24/17 120 lb 3.2 oz (54.5 kg)    Lab Results  Component Value Date   FREET4 0.58 (L) 08/03/2017   FREET4 3.62 (H) 06/22/2017   FREET4 0.90 04/27/2017   TSH 0.01 (L) 06/22/2017   TSH 0.69 04/27/2017   TSH 0.876 03/12/2017      Allergies as of 08/05/2017   No Known Allergies     Medication List       Accurate as of 08/05/17  9:50 AM. Always use your most recent med list.          Prenatal Vitamin 27-0.8 MG Tabs Take 1 tablet by mouth 2 (two) times daily.   propylthiouracil 50 MG tablet Commonly known as:  PTU Take 2 tablets (100 mg total) by mouth 3 (three) times daily.       Allergies: No Known Allergies  Past Medical History:  Diagnosis Date  . Medical history non-contributory   . Thyroid disease     Past Surgical History:  Procedure Laterality Date  . NO PAST SURGERIES      Family History  Problem Relation Age of Onset  . Thyroid disease Neg Hx   . Cancer Neg Hx   . Diabetes Neg Hx     Social History:  reports that she has never smoked. She has never used smokeless tobacco. She reports that she does not drink alcohol or use drugs.    Review of Systems  Respiratory: Negative for shortness of breath.       Examination:   BP 112/74   Pulse 98   Ht 5\' 1"  (1.549 m)   Wt  128 lb 6.4 oz (58.2 kg)   SpO2 99%   BMI 24.26 kg/m            The thyroid is enlarged Bilaterally Right lobe is enlarged about 2-1/2 times normal, slightly irregular surface she also has a  prominent isthmus Left lobe enlarged about twice normal  Neurological: She has no significant tremor REFLEXES: at biceps are normal Skin appears normal  Assessment/Plan:  HYPERTHYROIDISM associated with early pregnancy Although she was not very symptomatic and treated only with minimal doses of PTU her hyperthyroidism has resolved and free T4 is now mildly decreased Her goiter feels about the same as before She does not appear to have any symptoms suggestive of hypothyroidism  Since she possibly may have had a silent thyroiditis episode and now mildly hypothyroid Will stop her PTU and follow-up in another  month without any treatment  [facility] 08/05/2017

## 2017-09-03 DIAGNOSIS — Z3A20 20 weeks gestation of pregnancy: Secondary | ICD-10-CM | POA: Diagnosis not present

## 2017-09-03 DIAGNOSIS — Z23 Encounter for immunization: Secondary | ICD-10-CM | POA: Diagnosis not present

## 2017-09-03 DIAGNOSIS — R8271 Bacteriuria: Secondary | ICD-10-CM | POA: Diagnosis not present

## 2017-09-03 DIAGNOSIS — O30002 Twin pregnancy, unspecified number of placenta and unspecified number of amniotic sacs, second trimester: Secondary | ICD-10-CM | POA: Diagnosis not present

## 2017-09-03 DIAGNOSIS — Z3402 Encounter for supervision of normal first pregnancy, second trimester: Secondary | ICD-10-CM | POA: Diagnosis not present

## 2017-09-03 DIAGNOSIS — Z363 Encounter for antenatal screening for malformations: Secondary | ICD-10-CM | POA: Diagnosis not present

## 2017-09-15 ENCOUNTER — Encounter: Payer: Self-pay | Admitting: Endocrinology

## 2017-09-15 ENCOUNTER — Ambulatory Visit (INDEPENDENT_AMBULATORY_CARE_PROVIDER_SITE_OTHER): Payer: BLUE CROSS/BLUE SHIELD | Admitting: Endocrinology

## 2017-09-15 VITALS — BP 120/82 | HR 95 | Ht 61.0 in | Wt 138.6 lb

## 2017-09-15 DIAGNOSIS — E069 Thyroiditis, unspecified: Secondary | ICD-10-CM | POA: Diagnosis not present

## 2017-09-15 LAB — TSH: TSH: 0.51 u[IU]/mL (ref 0.35–4.50)

## 2017-09-15 LAB — T4, FREE: Free T4: 0.68 ng/dL (ref 0.60–1.60)

## 2017-09-15 NOTE — Progress Notes (Signed)
Patient ID: Desiree Watson, female   DOB: 1991/07/01, 26 y.o.   MRN: [id number]              Reason for Appointment: Follow-up of thyroid    History of Present Illness:    History obtained on initial consultation in June: The patient's thyroid enlargement was first discovered in 2011 approximately, at that time she was having a sensation of something in her throat when swallowing She was apparently in [place] at that time and was seen by a local physician She does not know what diagnosis was made.  However she was given an unknown medication which she took for 3 years With this her local symptoms resolved and she was told to stop the medication after taking it for 3 years She has had no difficulty with swallowing, however she has a feeling of something stuck in her throat area when swallowing although it is not uncomfortable  After initial consultation in 6/18 she had findings of euthyroid goiter with insignificant nodules on initial evaluation and no evidence of Hashimoto's thyroiditis She was seen in early pregnancy in 8/18 for follow-up of her thyroid when she was complaining of a little more fatigue and general weakness she was having vomiting but she did not have hyperemesis She did not have any typical symptoms of hyperthyroidism with shakiness, palpitations, heat intolerance or significant weight loss Because of her significant hyperthyroidism and free T4 of 3.6 she was started on PTU 50 mg 3 times a day Thyrotropin antibody was negative   RECENT history:   Since her thyrotropin receptor antibody was negative she was told to reduce her PTU dosage and eventually stopped in September In September her free T4 was below normal with normal TSH but she was subjectively doing fairly well  She still does not complain of any unusual fatigue, weakness, nausea or difficulty with skin or hair changes  She is 5 months pregnant and is due to deliver in 12/2017  Wt Readings from Last 3 Encounters:   09/15/17 138 lb 9.6 oz (62.9 kg)  08/05/17 128 lb 6.4 oz (58.2 kg)  06/25/17 118 lb (53.5 kg)    Lab Results  Component Value Date   FREET4 0.58 (L) 08/03/2017   FREET4 3.62 (H) 06/22/2017   FREET4 0.90 04/27/2017   TSH 0.01 (L) 06/22/2017   TSH 0.69 04/27/2017   TSH 0.876 03/12/2017      Allergies as of 09/15/2017   No Known Allergies     Medication List       Accurate as of 09/15/17 10:19 AM. Always use your most recent med list.          Prenatal Vitamin 27-0.8 MG Tabs Take 1 tablet by mouth 2 (two) times daily.       Allergies: No Known Allergies  Past Medical History:  Diagnosis Date  . Medical history non-contributory   . Thyroid disease     Past Surgical History:  Procedure Laterality Date  . NO PAST SURGERIES      Family History  Problem Relation Age of Onset  . Thyroid disease Neg Hx   . Cancer Neg Hx   . Diabetes Neg Hx     Social History:  reports that she has never smoked. She has never used smokeless tobacco. She reports that she does not drink alcohol or use drugs.    Review of Systems    Examination:   BP 120/82   Pulse 95   Ht 5\' 1"  (1.549 m)  Wt 138 lb 9.6 oz (62.9 kg)   SpO2 98%   BMI 26.19 kg/m            The thyroid is enlarged as follows: Right lobe is enlarged about 2 times normal, slightly firm, isthmus is enlarged independently  Left lobe enlarged about 1. 5-2 times normal, softer  Skin feels normal to temperature and not dry No tremor Reflexes are slightly brisk   Assessment/Plan:   Goiter associated with possible silent thyroiditis: her goiter is slightly smaller than before  Difficult to judge her symptoms as she is 5 months pregnant but overall she feels fairly good Since she had a slightly low free T4 on her last visit with low-dose PTU will need to have her labs checked again today Treatment and follow-up to be determined   [facility],Aslynn Brunetti 09/15/2017

## 2017-09-15 NOTE — Progress Notes (Signed)
Please call to let patient know that the lab results are normal and no further action needed

## 2017-10-28 DIAGNOSIS — Z3492 Encounter for supervision of normal pregnancy, unspecified, second trimester: Secondary | ICD-10-CM | POA: Diagnosis not present

## 2017-10-28 DIAGNOSIS — Z3A18 18 weeks gestation of pregnancy: Secondary | ICD-10-CM | POA: Diagnosis not present

## 2017-10-28 DIAGNOSIS — O30002 Twin pregnancy, unspecified number of placenta and unspecified number of amniotic sacs, second trimester: Secondary | ICD-10-CM | POA: Diagnosis not present

## 2017-11-12 DIAGNOSIS — Z3A30 30 weeks gestation of pregnancy: Secondary | ICD-10-CM | POA: Diagnosis not present

## 2017-11-12 DIAGNOSIS — O30043 Twin pregnancy, dichorionic/diamniotic, third trimester: Secondary | ICD-10-CM | POA: Diagnosis not present

## 2017-11-24 NOTE — L&D Delivery Note (Signed)
  Carin PrimroseMoul, Girl Chelsea Aushuy [[id number]][[id number]]  Delivery Note At 11:42 PM a viable female "Desiree Watson" was delivered via  (Presentation: Right Occiput Anterior ).  APGAR: Not Yet Calculated weight 5 lb 0.1 oz (2270 g).  Who was delivered by this provider, Sabas SousJ. Thuan Tippett, CNM.   Carin PrimroseMoul, Girl B [[id number]][[id number]]  Delivery Note At 11:47 PM a viable female "[name]" was delivered via  (Presentation Left Occiput Anterior).  APGAR: Not yet calculated weight 5 lb 6.8 oz (2460 g).  Who was delivered by Dr. Katharine LookJ. Ozan  Cord clamped, cut, and blood collected. Placenta delivered spontaneously and noted to be intact per Dr.J. Charlotta Newtonzan.  Placenta to pathology for PreEclampsia and IUGR diagnosis. Vaginal inspection revealed a 1st degree perineal and right periurethral laceration that was repaired with 3-0 vicryl on SH. 1% Lidocaine necessary for additional anesthetic and patient tolerated the procedure well. Fundus firm, at the umbilicus, and bleeding small.  Mother hemodynamically stable and infant skin to skin prior to provider exit.  Mother desires for birth control not addressed and she opts to bottle feed.        Anesthesia:  Epidural And Local for Repair Episiotomy: None Lacerations:  1st Degree Perineal and Right Side Periurethral Suture Repair: 3.0 vicryl Est. Blood Loss (mL): 200  Mom to [facility].  Baby to Couplet care / Skin to Skin.  Cherre RobinsJessica L Donold Marotto MSN, CNM 12/26/2017, 12:15 AM    [phone] Nurse call reports patient with boggy uterus.  In room to assess.  Uterus firm and bleeding remains small with trickling noted during fundal massage.  Given 600mcg of cytotec via buccal route.  Patient given instructions for usage.  No q/c.  [facility] MgSO[facility]

## 2017-11-27 DIAGNOSIS — Z3A32 32 weeks gestation of pregnancy: Secondary | ICD-10-CM | POA: Diagnosis not present

## 2017-11-27 DIAGNOSIS — O30043 Twin pregnancy, dichorionic/diamniotic, third trimester: Secondary | ICD-10-CM | POA: Diagnosis not present

## 2017-12-07 DIAGNOSIS — O139 Gestational [pregnancy-induced] hypertension without significant proteinuria, unspecified trimester: Secondary | ICD-10-CM | POA: Diagnosis not present

## 2017-12-07 DIAGNOSIS — O30009 Twin pregnancy, unspecified number of placenta and unspecified number of amniotic sacs, unspecified trimester: Secondary | ICD-10-CM | POA: Diagnosis not present

## 2017-12-07 DIAGNOSIS — Z3A33 33 weeks gestation of pregnancy: Secondary | ICD-10-CM | POA: Diagnosis not present

## 2017-12-09 ENCOUNTER — Encounter (HOSPITAL_COMMUNITY): Payer: Self-pay | Admitting: *Deleted

## 2017-12-09 ENCOUNTER — Inpatient Hospital Stay (HOSPITAL_COMMUNITY): Payer: BLUE CROSS/BLUE SHIELD

## 2017-12-09 ENCOUNTER — Inpatient Hospital Stay (HOSPITAL_COMMUNITY)
Admission: AD | Admit: 2017-12-09 | Discharge: 2017-12-09 | Disposition: A | Discharge status: Home / Self Care | Payer: BLUE CROSS/BLUE SHIELD | Source: Ambulatory Visit | Attending: Obstetrics and Gynecology | Admitting: Obstetrics and Gynecology

## 2017-12-09 DIAGNOSIS — Z3A33 33 weeks gestation of pregnancy: Secondary | ICD-10-CM | POA: Diagnosis not present

## 2017-12-09 DIAGNOSIS — O169 Unspecified maternal hypertension, unspecified trimester: Secondary | ICD-10-CM

## 2017-12-09 DIAGNOSIS — Z789 Other specified health status: Secondary | ICD-10-CM

## 2017-12-09 DIAGNOSIS — Z3403 Encounter for supervision of normal first pregnancy, third trimester: Secondary | ICD-10-CM | POA: Diagnosis not present

## 2017-12-09 DIAGNOSIS — Z758 Other problems related to medical facilities and other health care: Secondary | ICD-10-CM

## 2017-12-09 DIAGNOSIS — O133 Gestational [pregnancy-induced] hypertension without significant proteinuria, third trimester: Secondary | ICD-10-CM | POA: Diagnosis not present

## 2017-12-09 DIAGNOSIS — O30043 Twin pregnancy, dichorionic/diamniotic, third trimester: Secondary | ICD-10-CM | POA: Diagnosis not present

## 2017-12-09 DIAGNOSIS — O139 Gestational [pregnancy-induced] hypertension without significant proteinuria, unspecified trimester: Secondary | ICD-10-CM | POA: Diagnosis not present

## 2017-12-09 DIAGNOSIS — O30009 Twin pregnancy, unspecified number of placenta and unspecified number of amniotic sacs, unspecified trimester: Secondary | ICD-10-CM | POA: Diagnosis present

## 2017-12-09 MED ORDER — BETAMETHASONE SOD PHOS & ACET 6 (3-3) MG/ML IJ SUSP
12.0000 mg | INTRAMUSCULAR | Status: DC
  Administered 2017-12-09: 12 mg via INTRAMUSCULAR
  Filled 2017-12-09: qty 2

## 2017-12-09 MED ORDER — BETAMETHASONE SOD PHOS & ACET 6 (3-3) MG/ML IJ SUSP: 12.5000 mg | INTRAMUSCULAR | Status: DC

## 2017-12-09 NOTE — MAU Note (Signed)
Pt here for first BMZ injection, just came from u/s.

## 2017-12-09 NOTE — MAU Note (Signed)
  History   27 yo G2P1001 at 2333 6/7 weeks with twin pregnancy was sent to MAU to obtain a BPP and initiate betamethasone course--seen in office today with BP 120s/90.  Had regular office visit on 1/14, with BP 120/80 and 120/96.  PIH labs were WNL, and PCR was 0.24.  She denied HA, visual sx, or epigastric pain.  F/u visit was scheduled today, with plan for BPP, but that was ordered for next week.    Office had no availability for BPP today, [facility] not [facility], and patient was to receive betamethasone course, so was sent to MAU.  Does not require monitoring at this time.  Twins have shown appropriate growth, with last scan at 32 weeks--Twin A vtx, 4+3, 36%ile, and Twin B vtx, 4+1, 30%ile.  Patient Active Problem List   Diagnosis Date Noted  . Gestational hypertension 12/09/2017  . Twin pregnancy--di/di 12/09/2017  . Language barrier 04/15/2015     HPI:  As above  OB History    Gravida Para Term Preterm AB Living   2 1 1     1    SAB TAB Ectopic Multiple Live Births         0 1      Past Medical History:  Diagnosis Date  . Medical history non-contributory   . Thyroid disease     Past Surgical History:  Procedure Laterality Date  . NO PAST SURGERIES      Family History  Problem Relation Age of Onset  . Thyroid disease Neg Hx   . Cancer Neg Hx   . Diabetes Neg Hx     Social History   Tobacco Use  . Smoking status: Never Smoker  . Smokeless tobacco: Never Used  Substance Use Topics  . Alcohol use: No  . Drug use: No    Allergies: No Known Allergies  Medications Prior to Admission  Medication Sig Dispense Refill Last Dose  . Prenatal Vit-Fe Fumarate-FA (PRENATAL VITAMIN) 27-0.8 MG TABS Take 1 tablet by mouth 2 (two) times daily.   Taking    ROS:  Denies HA, visual sx, epigastric pain, or swelling.  Reports positive FM x 2. Physical Exam   Performed at office: Chest clear Heart RRR without mumrur Abd gravid, NT Pelvic--deferred Ext DTR 1+,  no clonus, trace edema.  UA negative for protein  FHR Twin A on right--140 FHR Twin B on left--150  ED Course  Assessment: Twin IUP at 6833 6/[redacted] weeks Gestational hypertension  Plan: Per consult with Dr. Estanislado Pandyivard: BPP today Initiate betamethasone course Has been scheduled [facility], with weekly BPPs, weekly PIH labs, anticipate delivery at 37 weeks if BP remains elevated. Patient to now be OOW, resting at home.  Note given at office visit. These plans were reviewed with patient and husband at the office visit, and they were agreeable with them, and were able to recount them back to me. PIH precautions were reviewed with the patient and husband.   Nigel BridgemanVicki Danny Yackley CNM, MSN 12/09/2017 4:07 PM

## 2017-12-10 ENCOUNTER — Inpatient Hospital Stay (HOSPITAL_COMMUNITY)
Admission: AD | Admit: 2017-12-10 | Discharge: 2017-12-10 | Disposition: A | Discharge status: Home / Self Care | Payer: BLUE CROSS/BLUE SHIELD | Source: Ambulatory Visit | Attending: Obstetrics and Gynecology | Admitting: Obstetrics and Gynecology

## 2017-12-10 DIAGNOSIS — Z3A34 34 weeks gestation of pregnancy: Secondary | ICD-10-CM | POA: Insufficient documentation

## 2017-12-10 DIAGNOSIS — Z3493 Encounter for supervision of normal pregnancy, unspecified, third trimester: Secondary | ICD-10-CM | POA: Insufficient documentation

## 2017-12-10 MED ORDER — BETAMETHASONE SOD PHOS & ACET 6 (3-3) MG/ML IJ SUSP
12.0000 mg | Freq: Once | INTRAMUSCULAR | Status: AC
  Administered 2017-12-10: 12 mg via INTRAMUSCULAR
  Filled 2017-12-10: qty 2

## 2017-12-10 NOTE — MAU Note (Signed)
Here for 2nd injection. No c/o offered.  No problems with injection yesterday.

## 2017-12-10 NOTE — MAU Note (Signed)
Back for 2nd dose of betamethasone

## 2017-12-15 DIAGNOSIS — Z8759 Personal history of other complications of pregnancy, childbirth and the puerperium: Secondary | ICD-10-CM | POA: Diagnosis not present

## 2017-12-17 DIAGNOSIS — O30009 Twin pregnancy, unspecified number of placenta and unspecified number of amniotic sacs, unspecified trimester: Secondary | ICD-10-CM | POA: Diagnosis not present

## 2017-12-17 DIAGNOSIS — O139 Gestational [pregnancy-induced] hypertension without significant proteinuria, unspecified trimester: Secondary | ICD-10-CM | POA: Diagnosis not present

## 2017-12-17 DIAGNOSIS — Z3A36 36 weeks gestation of pregnancy: Secondary | ICD-10-CM | POA: Diagnosis not present

## 2017-12-18 ENCOUNTER — Other Ambulatory Visit (HOSPITAL_COMMUNITY): Payer: Self-pay | Admitting: Obstetrics & Gynecology

## 2017-12-18 DIAGNOSIS — O365931 Maternal care for other known or suspected poor fetal growth, third trimester, fetus 1: Secondary | ICD-10-CM

## 2017-12-18 DIAGNOSIS — Z3A37 37 weeks gestation of pregnancy: Secondary | ICD-10-CM

## 2017-12-18 DIAGNOSIS — O163 Unspecified maternal hypertension, third trimester: Secondary | ICD-10-CM

## 2017-12-21 ENCOUNTER — Telehealth (HOSPITAL_COMMUNITY): Payer: Self-pay | Admitting: *Deleted

## 2017-12-21 ENCOUNTER — Ambulatory Visit (HOSPITAL_COMMUNITY)
Admission: RE | Admit: 2017-12-21 | Discharge: 2017-12-21 | Disposition: A | Discharge status: Home / Self Care | Payer: BLUE CROSS/BLUE SHIELD | Source: Ambulatory Visit | Attending: Obstetrics & Gynecology | Admitting: Obstetrics & Gynecology

## 2017-12-21 ENCOUNTER — Encounter (HOSPITAL_COMMUNITY): Payer: Self-pay

## 2017-12-21 ENCOUNTER — Other Ambulatory Visit (HOSPITAL_COMMUNITY): Payer: Self-pay | Admitting: Obstetrics & Gynecology

## 2017-12-21 ENCOUNTER — Other Ambulatory Visit (HOSPITAL_COMMUNITY): Payer: Self-pay | Admitting: *Deleted

## 2017-12-21 DIAGNOSIS — O30043 Twin pregnancy, dichorionic/diamniotic, third trimester: Secondary | ICD-10-CM | POA: Diagnosis not present

## 2017-12-21 DIAGNOSIS — Z3A37 37 weeks gestation of pregnancy: Secondary | ICD-10-CM

## 2017-12-21 DIAGNOSIS — Z3A35 35 weeks gestation of pregnancy: Secondary | ICD-10-CM | POA: Diagnosis not present

## 2017-12-21 DIAGNOSIS — O365932 Maternal care for other known or suspected poor fetal growth, third trimester, fetus 2: Secondary | ICD-10-CM | POA: Insufficient documentation

## 2017-12-21 DIAGNOSIS — O133 Gestational [pregnancy-induced] hypertension without significant proteinuria, third trimester: Secondary | ICD-10-CM | POA: Insufficient documentation

## 2017-12-21 DIAGNOSIS — O365931 Maternal care for other known or suspected poor fetal growth, third trimester, fetus 1: Secondary | ICD-10-CM | POA: Insufficient documentation

## 2017-12-21 DIAGNOSIS — O163 Unspecified maternal hypertension, third trimester: Secondary | ICD-10-CM

## 2017-12-21 DIAGNOSIS — O365132 Maternal care for known or suspected placental insufficiency, third trimester, fetus 2: Secondary | ICD-10-CM | POA: Diagnosis not present

## 2017-12-21 NOTE — Telephone Encounter (Signed)
Preadmission screen Interpreter number [id number] 

## 2017-12-21 NOTE — Telephone Encounter (Signed)
Preadmission screen  

## 2017-12-22 DIAGNOSIS — Z3493 Encounter for supervision of normal pregnancy, unspecified, third trimester: Secondary | ICD-10-CM | POA: Diagnosis not present

## 2017-12-22 DIAGNOSIS — Z3A35 35 weeks gestation of pregnancy: Secondary | ICD-10-CM | POA: Diagnosis not present

## 2017-12-24 ENCOUNTER — Other Ambulatory Visit: Payer: Self-pay | Admitting: Obstetrics and Gynecology

## 2017-12-25 ENCOUNTER — Inpatient Hospital Stay (HOSPITAL_COMMUNITY)
Admission: AD | Admit: 2017-12-25 | Discharge: 2017-12-27 | DRG: 807 | Disposition: A | Discharge status: Home / Self Care | Payer: BLUE CROSS/BLUE SHIELD | Source: Ambulatory Visit | Attending: Obstetrics and Gynecology | Admitting: Obstetrics and Gynecology

## 2017-12-25 ENCOUNTER — Encounter (HOSPITAL_COMMUNITY): Payer: Self-pay

## 2017-12-25 ENCOUNTER — Inpatient Hospital Stay (HOSPITAL_COMMUNITY): Payer: BLUE CROSS/BLUE SHIELD | Admitting: Anesthesiology

## 2017-12-25 DIAGNOSIS — Z3A36 36 weeks gestation of pregnancy: Secondary | ICD-10-CM | POA: Diagnosis not present

## 2017-12-25 DIAGNOSIS — O99824 Streptococcus B carrier state complicating childbirth: Secondary | ICD-10-CM | POA: Diagnosis present

## 2017-12-25 DIAGNOSIS — O365931 Maternal care for other known or suspected poor fetal growth, third trimester, fetus 1: Secondary | ICD-10-CM | POA: Diagnosis not present

## 2017-12-25 DIAGNOSIS — O30043 Twin pregnancy, dichorionic/diamniotic, third trimester: Secondary | ICD-10-CM | POA: Diagnosis not present

## 2017-12-25 DIAGNOSIS — O139 Gestational [pregnancy-induced] hypertension without significant proteinuria, unspecified trimester: Secondary | ICD-10-CM | POA: Diagnosis not present

## 2017-12-25 DIAGNOSIS — O30009 Twin pregnancy, unspecified number of placenta and unspecified number of amniotic sacs, unspecified trimester: Secondary | ICD-10-CM | POA: Diagnosis not present

## 2017-12-25 DIAGNOSIS — E049 Nontoxic goiter, unspecified: Secondary | ICD-10-CM | POA: Diagnosis not present

## 2017-12-25 DIAGNOSIS — O365932 Maternal care for other known or suspected poor fetal growth, third trimester, fetus 2: Secondary | ICD-10-CM | POA: Diagnosis present

## 2017-12-25 DIAGNOSIS — O99284 Endocrine, nutritional and metabolic diseases complicating childbirth: Secondary | ICD-10-CM | POA: Diagnosis not present

## 2017-12-25 DIAGNOSIS — O1404 Mild to moderate pre-eclampsia, complicating childbirth: Secondary | ICD-10-CM | POA: Diagnosis not present

## 2017-12-25 DIAGNOSIS — O1494 Unspecified pre-eclampsia, complicating childbirth: Secondary | ICD-10-CM | POA: Diagnosis not present

## 2017-12-25 DIAGNOSIS — E042 Nontoxic multinodular goiter: Secondary | ICD-10-CM | POA: Diagnosis present

## 2017-12-25 DIAGNOSIS — O134 Gestational [pregnancy-induced] hypertension without significant proteinuria, complicating childbirth: Secondary | ICD-10-CM | POA: Diagnosis not present

## 2017-12-25 DIAGNOSIS — Z23 Encounter for immunization: Secondary | ICD-10-CM | POA: Diagnosis not present

## 2017-12-25 DIAGNOSIS — O1493 Unspecified pre-eclampsia, third trimester: Secondary | ICD-10-CM | POA: Diagnosis not present

## 2017-12-25 DIAGNOSIS — O1490 Unspecified pre-eclampsia, unspecified trimester: Secondary | ICD-10-CM | POA: Diagnosis present

## 2017-12-25 LAB — CBC
HCT: 42.6 % (ref 36.0–46.0)
Hemoglobin: 14.6 g/dL (ref 12.0–15.0)
MCH: 29.1 pg (ref 26.0–34.0)
MCHC: 34.3 g/dL (ref 30.0–36.0)
MCV: 85 fL (ref 78.0–100.0)
PLATELETS: 199 10*3/uL (ref 150–400)
RBC: 5.01 MIL/uL (ref 3.87–5.11)
RDW: 14.4 % (ref 11.5–15.5)
WBC: 8 10*3/uL (ref 4.0–10.5)

## 2017-12-25 LAB — TYPE AND SCREEN
ABO/RH(D): B POS
ANTIBODY SCREEN: NEGATIVE

## 2017-12-25 MED ORDER — SODIUM CHLORIDE 0.9 % IV SOLN
2.0000 g | Freq: Four times a day (QID) | INTRAVENOUS | Status: DC
  Administered 2017-12-25: 2 g via INTRAVENOUS
  Filled 2017-12-25 (×4): qty 2000

## 2017-12-25 MED ORDER — SOD CITRATE-CITRIC ACID 500-334 MG/5ML PO SOLN: 30.0000 mL | ORAL | Status: DC | PRN

## 2017-12-25 MED ORDER — OXYTOCIN 40 UNITS IN LACTATED RINGERS INFUSION - SIMPLE MED: 1.0000 m[IU]/min | INTRAVENOUS | Status: DC

## 2017-12-25 MED ORDER — LACTATED RINGERS IV SOLN
INTRAVENOUS | Status: DC
  Administered 2017-12-25 (×2): via INTRAVENOUS

## 2017-12-25 MED ORDER — TERBUTALINE SULFATE 1 MG/ML IJ SOLN: 0.2500 mg | Freq: Once | INTRAMUSCULAR | Status: DC | PRN

## 2017-12-25 MED ORDER — LABETALOL HCL 5 MG/ML IV SOLN: 20.0000 mg | INTRAVENOUS | Status: DC | PRN

## 2017-12-25 MED ORDER — LACTATED RINGERS IV SOLN
500.0000 mL | INTRAVENOUS | Status: DC | PRN
  Administered 2017-12-25: 500 mL via INTRAVENOUS

## 2017-12-25 MED ORDER — DIPHENHYDRAMINE HCL 50 MG/ML IJ SOLN: 12.5000 mg | INTRAMUSCULAR | Status: DC | PRN

## 2017-12-25 MED ORDER — ACETAMINOPHEN 325 MG PO TABS: 650.0000 mg | ORAL_TABLET | ORAL | Status: DC | PRN

## 2017-12-25 MED ORDER — LACTATED RINGERS IV SOLN: 500.0000 mL | Freq: Once | INTRAVENOUS | Status: DC

## 2017-12-25 MED ORDER — OXYCODONE-ACETAMINOPHEN 5-325 MG PO TABS: 2.0000 | ORAL_TABLET | ORAL | Status: DC | PRN

## 2017-12-25 MED ORDER — EPHEDRINE 5 MG/ML INJ
10.0000 mg | INTRAVENOUS | Status: DC | PRN
  Filled 2017-12-25: qty 2

## 2017-12-25 MED ORDER — OXYTOCIN BOLUS FROM INFUSION
500.0000 mL | Freq: Once | INTRAVENOUS | Status: AC
  Administered 2017-12-25: 500 mL via INTRAVENOUS

## 2017-12-25 MED ORDER — MAGNESIUM SULFATE BOLUS VIA INFUSION
4.0000 g | Freq: Once | INTRAVENOUS | Status: AC
  Administered 2017-12-25: 4 g via INTRAVENOUS
  Filled 2017-12-25: qty 500

## 2017-12-25 MED ORDER — LIDOCAINE HCL (PF) 1 % IJ SOLN
INTRAMUSCULAR | Status: DC | PRN
  Administered 2017-12-25 (×2): 5 mL

## 2017-12-25 MED ORDER — OXYTOCIN 40 UNITS IN LACTATED RINGERS INFUSION - SIMPLE MED
1.0000 m[IU]/min | INTRAVENOUS | Status: DC
  Administered 2017-12-25: 2 m[IU]/min via INTRAVENOUS

## 2017-12-25 MED ORDER — ONDANSETRON HCL 4 MG/2ML IJ SOLN: 4.0000 mg | Freq: Four times a day (QID) | INTRAMUSCULAR | Status: DC | PRN

## 2017-12-25 MED ORDER — PHENYLEPHRINE 40 MCG/ML (10ML) SYRINGE FOR IV PUSH (FOR BLOOD PRESSURE SUPPORT)
80.0000 ug | PREFILLED_SYRINGE | INTRAVENOUS | Status: DC | PRN
  Filled 2017-12-25: qty 5

## 2017-12-25 MED ORDER — PHENYLEPHRINE 40 MCG/ML (10ML) SYRINGE FOR IV PUSH (FOR BLOOD PRESSURE SUPPORT)
80.0000 ug | PREFILLED_SYRINGE | INTRAVENOUS | Status: DC | PRN
  Filled 2017-12-25: qty 5
  Filled 2017-12-25: qty 10

## 2017-12-25 MED ORDER — LIDOCAINE HCL (PF) 1 % IJ SOLN
30.0000 mL | INTRAMUSCULAR | Status: DC | PRN
  Administered 2017-12-25: 30 mL via SUBCUTANEOUS
  Filled 2017-12-25: qty 30

## 2017-12-25 MED ORDER — FLEET ENEMA 7-19 GM/118ML RE ENEM: 1.0000 | ENEMA | RECTAL | Status: DC | PRN

## 2017-12-25 MED ORDER — DEXTROSE 5 % IV SOLN
5.0000 10*6.[IU] | Freq: Once | INTRAVENOUS | Status: DC
  Administered 2017-12-25: 5 10*6.[IU] via INTRAVENOUS
  Filled 2017-12-25: qty 5

## 2017-12-25 MED ORDER — FENTANYL 2.5 MCG/ML BUPIVACAINE 1/10 % EPIDURAL INFUSION (WH - ANES)
14.0000 mL/h | INTRAMUSCULAR | Status: DC | PRN
  Administered 2017-12-25: 14 mL/h via EPIDURAL
  Filled 2017-12-25: qty 100

## 2017-12-25 MED ORDER — PENICILLIN G POT IN DEXTROSE 60000 UNIT/ML IV SOLN
3.0000 10*6.[IU] | INTRAVENOUS | Status: DC
  Filled 2017-12-25 (×2): qty 50

## 2017-12-25 MED ORDER — MAGNESIUM SULFATE 40 G IN LACTATED RINGERS - SIMPLE
2.0000 g/h | INTRAVENOUS | Status: DC
  Administered 2017-12-25: 2 g/h via INTRAVENOUS
  Filled 2017-12-25: qty 40

## 2017-12-25 MED ORDER — OXYCODONE-ACETAMINOPHEN 5-325 MG PO TABS: 1.0000 | ORAL_TABLET | ORAL | Status: DC | PRN

## 2017-12-25 MED ORDER — OXYTOCIN 40 UNITS IN LACTATED RINGERS INFUSION - SIMPLE MED
2.5000 [IU]/h | INTRAVENOUS | Status: DC
  Administered 2017-12-26: 2.5 [IU]/h via INTRAVENOUS
  Filled 2017-12-25: qty 1000

## 2017-12-25 MED ORDER — HYDRALAZINE HCL 20 MG/ML IJ SOLN: 10.0000 mg | Freq: Once | INTRAMUSCULAR | Status: DC | PRN

## 2017-12-25 NOTE — Progress Notes (Addendum)
Vivianne Masterhuy Ferrante MRN: [id number]  Subjective: -Patient reports comfort with epidural.  FOB at bedside supportive and acting as interpreter.   Objective: BP (!) 128/94   Pulse 87   Temp 98.2 F (36.8 C) (Oral)   Resp 18   Ht 5\' 4"  (1.626 m)   Wt 72.1 kg (159 lb)   SpO2 100%   BMI 27.29 kg/m  I/O last 3 completed shifts: In: 388.5 [P.O.:50; I.V.:288.5; IV Piggyback:50] Out: 0  Total I/O In: 1038.6 [P.O.:225; I.V.:813.4; Other:0.2] Out: 375 [Urine:375]  Fetal Monitoring: FHT: 135 bpm, Mod Var, -Decels, +Accels Baby B: 135 bpm, Mod Var, -Decels, +Accels UC: Q2-783min    Vaginal Exam: SVE:   Dilation: 6 Effacement (%): 90 Station: -1 Exam by:: Gerrit HeckJessica Alveria Mcglaughlin CNM Membranes:AROM with clear small amt fluid Internal Monitors: None  Augmentation/Induction: Pitocin:776mUn/min Cytotec: None  Assessment:  TIUP at 36.1wks Cat I FT  Amniotomy  Plan: -AROM performed without incident -Patient to call with any feelings of rectal pressure -lan for MD to be present at delivery -Continue other mgmt as ordered  Valma CavaJessica L Ileah Falkenstein,MSN, CNM 12/25/2017, 9:42 PM

## 2017-12-25 NOTE — H&P (Signed)
Desiree Watson is a 27 y.o. female presenting for preterm di di twins @ 36+1 ; preeclampsia; IUGR; Dr Estanislado Pandy in to explain plan of care to pt. OB History    Gravida Para Term Preterm AB Living   2 1 1     1    SAB TAB Ectopic Multiple Live Births         0 1     Past Medical History:  Diagnosis Date  . Medical history non-contributory   . Thyroid disease    Past Surgical History:  Procedure Laterality Date  . NO PAST SURGERIES     Family History: family history is not on file. Social History:  reports that  has never smoked. she has never used smokeless tobacco. She reports that she does not drink alcohol or use drugs.     Maternal Diabetes: No Genetic Screening:  Maternal Ultrasounds/Referrals: Abnormal:  Findings:   IUGR Fetal Ultrasounds or other Referrals:  None Maternal Substance Abuse:  No Significant Maternal Medications:  None Significant Maternal Lab Results:  PCR- Other Comments:  None  ROS Maternal Medical History:  Contractions: Frequency: rare.    Fetal activity: Perceived fetal activity is normal.    Prenatal complications: IUGR and pre-eclampsia.   twins    Dilation: 4 Effacement (%): 90 Station: -1 Exam by:: Marlon Vonruden, CNM Blood pressure 133/86, pulse 88, temperature 98.2 F (36.8 C), temperature source Oral, resp. rate 20, weight 159 lb (72.1 kg), unknown if currently breastfeeding. Maternal Exam:  Uterine Assessment: Contraction frequency is rare.   Abdomen: Patient reports no abdominal tenderness. Estimated fetal weight is 4-8 ; 4 11.   Fetal presentation: vertex  Introitus: Normal vagina.  Pelvis: adequate for delivery.   Cervix: Cervix evaluated by digital exam.     Fetal Exam Fetal Monitor Review: Mode: ultrasound.   Pattern: accelerations present.    Fetal State Assessment: Category I - tracings are normal.    Baby a- Cat 1 Baseline 145 Baby B Cat1 Baseline 150 Physical Exam  Nursing note and vitals reviewed. Constitutional: She is  oriented to person, place, and time. She appears well-developed and well-nourished.  HENT:  Head: Normocephalic and atraumatic.  Cardiovascular: Normal rate and regular rhythm.  Respiratory: Effort normal and breath sounds normal. No respiratory distress.  GI: Soft. There is no tenderness.  Genitourinary: Vagina normal.  Musculoskeletal: Normal range of motion.  Neurological: She is alert and oriented to person, place, and time.  Skin: Skin is warm and dry.  Psychiatric: She has a normal mood and affect. Her behavior is normal. Thought content normal.    Prenatal labs: ABO, Rh: --/--/B POS (02/01 1720) Antibody: NEG (02/01 1720) Rubella: Immune (08/24 0000) RPR: Nonreactive (08/24 0000)  HBsAg: Negative (08/24 0000)  HIV: Non-reactive (08/24 0000)  GBS:   positive Patient Vitals for the past 24 hrs:  BP Temp Temp src Pulse Resp Weight  12/25/17 1754 133/86 - - 88 20 -  12/25/17 1726 (!) 144/84 98.2 F (36.8 C) Oral 88 19 -  12/25/17 1645 (!) 152/106 - - (!) 104 - -  12/25/17 1630 (!) 140/98 - - 96 - -  12/25/17 1615 (!) 146/100 - - 96 - -  12/25/17 1600 (!) 134/99 - - 97 - -  12/25/17 1545 (!) 148/120 - - (!) 106 - -  12/25/17 1534 (!) 133/95 - - 91 - -  12/25/17 1515 (!) 137/97 - - 92 - -  12/25/17 1508 - 98.7 F (37.1 C) Oral -  18 -  12/25/17 1507 (!) 147/104 - - 96 - 159 lb (72.1 kg)   Results for orders placed or performed during the hospital encounter of 12/25/17 (from the past 24 hour(s))  CBC     Status: None   Collection Time: 12/25/17  5:20 PM  Result Value Ref Range   WBC 8.0 4.0 - 10.5 K/uL   RBC 5.01 3.87 - 5.11 MIL/uL   Hemoglobin 14.6 12.0 - 15.0 g/dL   HCT 16.142.6 09.636.0 - 04.546.0 %   MCV 85.0 78.0 - 100.0 fL   MCH 29.1 26.0 - 34.0 pg   MCHC 34.3 30.0 - 36.0 g/dL   RDW 40.914.4 81.111.5 - 91.415.5 %   Platelets 199 150 - 400 K/uL  Type and screen [facility]     Status: None   Collection Time: 12/25/17  5:20 PM  Result Value Ref Range   ABO/RH(D) B  POS    Antibody Screen NEG    Sample Expiration      12/28/2017 Performed at [facility], [address] [place], [place] [id number]    Assessment/Plan: Admit to L/D Twin gestation @ 36+1 IUGR Preeclampsia Induction- Active management; Pitocin Dr Charlotta Newtonzan on GBS Positive Ancef 2 gms Magnesium Sulfate 4 gm load then 2 gms/hr    Lawson FiscalLori A Darleen Moffitt CNM 12/25/2017, 6:16 PM

## 2017-12-25 NOTE — MAU Note (Signed)
Pt sent from Dr. Sallye OberKulwa to have NST and serial BP. Lab work done in office and PCR was 0.89. No headaches or visual changes, pain in RUQ

## 2017-12-25 NOTE — Progress Notes (Addendum)
Vivianne Masterhuy Tillison MRN: [id number]  Subjective: -Care assumed of 27 y.o. G2P1001 at 3274w1d who presents for IOL s/t PreEclampsia with IUGR.  Patient also expecting Di/Di twins that are said to be vertex.  Patient is under the care of [facility] and pregnancy history significant for goiter and positive GBS In room to meet acquaintance of patient. Interpreter services utilized. Reports pain on left side with contractions.  Denies HA, visual disturbances, LOF, and RUQ pain. Reports desire for epidural placement.   Objective: BP 140/90   Pulse 92   Temp 98.2 F (36.8 C) (Oral)   Resp 18   Wt 72.1 kg (159 lb)   BMI 30.04 kg/m  I/O last 3 completed shifts: In: 388.5 [P.O.:50; I.V.:288.5; IV Piggyback:50] Out: 0  No intake/output data recorded.  Fetal Monitoring: NWG:NFAOFHT:Baby A 140 bpm, Mod Var, -Decels, +Accels Baby B: 140 bpm, Mod Var, -Decels, +Accels UC: Q2-833min, palpates mild to moderate    Physical Exam: General appearance: oriented to person, place, and time. Chest: normal rate and regular rhythm.  clear to auscultation, no wheezes, rales or rhonchi, symmetric air entry. Abdominal exam: soft, nontender, nondistended, no masses or organomegaly. Extremities: Mild Edema Skin exam: Warm Dry  Vaginal Exam: SVE:   Dilation: 4 Effacement (%): 90 Station: -1 Exam by:: Clemmons, CNM Membranes:Intact Internal Monitors: None  Augmentation/Induction: Pitocin:374mUn/min Cytotec: None  Assessment:  TIUP at 36.1wks Cat I FT  PreEclampsia MgSO4 Infusion Di/Di Twins  Plan: -Okay for epidural placement -Will return to perform AROM once comfortable from epidural -Discussed AROM r/b including increased risk of infection, cord prolapse, fetal intolerance, and decreased labor time. No questions or concerns and patient desires to proceed with AROM.  -No q/c -Plan for active mgmt per consult with Dr. Katharine LookJ. Ozan -Continue other mgmt as ordered  Valma CavaJessica L Finlee Milo,MSN, CNM 12/25/2017, 7:58 PM

## 2017-12-25 NOTE — Anesthesia Preprocedure Evaluation (Signed)
Anesthesia Evaluation  Patient identified by MRN, date of birth, ID band Patient awake    Reviewed: Allergy & Precautions, H&P , NPO status , Patient's Chart, lab work & pertinent test results  History of Anesthesia Complications Negative for: history of anesthetic complications  Airway Mallampati: II  TM Distance: >3 FB Neck ROM: full    Dental no notable dental hx. (+) Teeth Intact   Pulmonary neg pulmonary ROS,    Pulmonary exam normal breath sounds clear to auscultation       Cardiovascular hypertension (on mag), Normal cardiovascular exam Rhythm:regular Rate:Normal     Neuro/Psych negative neurological ROS  negative psych ROS   GI/Hepatic negative GI ROS, Neg liver ROS,   Endo/Other  negative endocrine ROS  Renal/GU negative Renal ROS  negative genitourinary   Musculoskeletal   Abdominal   Peds  Hematology negative hematology ROS (+)   Anesthesia Other Findings twins  Reproductive/Obstetrics (+) Pregnancy                             Anesthesia Physical Anesthesia Plan  ASA: II  Anesthesia Plan: Epidural   Post-op Pain Management:    Induction:   PONV Risk Score and Plan:   Airway Management Planned:   Additional Equipment:   Intra-op Plan:   Post-operative Plan:   Informed Consent: I have reviewed the patients History and Physical, chart, labs and discussed the procedure including the risks, benefits and alternatives for the proposed anesthesia with the patient or authorized representative who has indicated his/her understanding and acceptance.     Plan Discussed with:   Anesthesia Plan Comments:         Anesthesia Quick Evaluation

## 2017-12-25 NOTE — Anesthesia Procedure Notes (Signed)
Epidural Patient location during procedure: OB  Staffing Anesthesiologist: Nichlas Pitera, MD Performed: anesthesiologist   Preanesthetic Checklist Completed: patient identified, site marked, surgical consent, pre-op evaluation, timeout performed, IV checked, risks and benefits discussed and monitors and equipment checked  Epidural Patient position: sitting Prep: DuraPrep Patient monitoring: heart rate, continuous pulse ox and blood pressure Approach: right paramedian Location: L3-L4 Injection technique: LOR saline  Needle:  Needle type: Tuohy  Needle gauge: 17 G Needle length: 9 cm and 9 Needle insertion depth: 5 cm Catheter type: closed end flexible Catheter size: 20 Guage Catheter at skin depth: 9 cm Test dose: negative  Assessment Events: blood not aspirated, injection not painful, no injection resistance, negative IV test and no paresthesia  Additional Notes Patient identified. Risks/Benefits/Options discussed with patient including but not limited to bleeding, infection, nerve damage, paralysis, failed block, incomplete pain control, headache, blood pressure changes, nausea, vomiting, reactions to medication both or allergic, itching and postpartum back pain. Confirmed with bedside nurse the patient's most recent platelet count. Confirmed with patient that they are not currently taking any anticoagulation, have any bleeding history or any family history of bleeding disorders. Patient expressed understanding and wished to proceed. All questions were answered. Sterile technique was used throughout the entire procedure. Please see nursing notes for vital signs. Test dose was given through epidural needle and negative prior to continuing to dose epidural or start infusion. Warning signs of high block given to the patient including shortness of breath, tingling/numbness in hands, complete motor block, or any concerning symptoms with instructions to call for help. Patient was given  instructions on fall risk and not to get out of bed. All questions and concerns addressed with instructions to call with any issues.     

## 2017-12-26 ENCOUNTER — Encounter (HOSPITAL_COMMUNITY): Payer: Self-pay

## 2017-12-26 ENCOUNTER — Other Ambulatory Visit: Payer: Self-pay

## 2017-12-26 DIAGNOSIS — O1490 Unspecified pre-eclampsia, unspecified trimester: Secondary | ICD-10-CM | POA: Diagnosis present

## 2017-12-26 DIAGNOSIS — E042 Nontoxic multinodular goiter: Secondary | ICD-10-CM | POA: Diagnosis present

## 2017-12-26 LAB — CBC
HCT: 38.6 % (ref 36.0–46.0)
HEMATOCRIT: 38.9 % (ref 36.0–46.0)
HEMOGLOBIN: 13.4 g/dL (ref 12.0–15.0)
HEMOGLOBIN: 13.4 g/dL (ref 12.0–15.0)
MCH: 28.8 pg (ref 26.0–34.0)
MCH: 29.1 pg (ref 26.0–34.0)
MCHC: 34.4 g/dL (ref 30.0–36.0)
MCHC: 34.7 g/dL (ref 30.0–36.0)
MCV: 83.7 fL (ref 78.0–100.0)
MCV: 83.7 fL (ref 78.0–100.0)
Platelets: 178 10*3/uL (ref 150–400)
Platelets: 186 10*3/uL (ref 150–400)
RBC: 4.65 MIL/uL (ref 3.87–5.11)
RBC: 4.61 MIL/uL (ref 3.87–5.11)
RDW: 14.4 % (ref 11.5–15.5)
RDW: 14.3 % (ref 11.5–15.5)
WBC: 18.4 10*3/uL — AB (ref 4.0–10.5)
WBC: 16.3 10*3/uL — ABNORMAL HIGH (ref 4.0–10.5)

## 2017-12-26 LAB — RPR: RPR: NONREACTIVE

## 2017-12-26 MED ORDER — BENZOCAINE-MENTHOL 20-0.5 % EX AERO
1.0000 "application " | INHALATION_SPRAY | CUTANEOUS | Status: DC | PRN
  Filled 2017-12-26: qty 56

## 2017-12-26 MED ORDER — ZOLPIDEM TARTRATE 5 MG PO TABS: 5.0000 mg | ORAL_TABLET | Freq: Every evening | ORAL | Status: DC | PRN

## 2017-12-26 MED ORDER — PRENATAL MULTIVITAMIN CH
1.0000 | ORAL_TABLET | Freq: Every day | ORAL | Status: DC
  Administered 2017-12-26: 1 via ORAL
  Filled 2017-12-26: qty 1

## 2017-12-26 MED ORDER — TETANUS-DIPHTH-ACELL PERTUSSIS 5-2.5-18.5 LF-MCG/0.5 IM SUSP: 0.5000 mL | Freq: Once | INTRAMUSCULAR | Status: DC

## 2017-12-26 MED ORDER — WITCH HAZEL-GLYCERIN EX PADS: 1.0000 "application " | MEDICATED_PAD | CUTANEOUS | Status: DC | PRN

## 2017-12-26 MED ORDER — COCONUT OIL OIL: 1.0000 "application " | TOPICAL_OIL | Status: DC | PRN

## 2017-12-26 MED ORDER — MISOPROSTOL 200 MCG PO TABS
600.0000 ug | ORAL_TABLET | Freq: Once | ORAL | Status: AC
  Administered 2017-12-26: 600 ug via BUCCAL
  Filled 2017-12-26: qty 3

## 2017-12-26 MED ORDER — ONDANSETRON HCL 4 MG/2ML IJ SOLN: 4.0000 mg | INTRAMUSCULAR | Status: DC | PRN

## 2017-12-26 MED ORDER — MAGNESIUM SULFATE 40 G IN LACTATED RINGERS - SIMPLE
2.0000 g/h | INTRAVENOUS | Status: AC
  Administered 2017-12-26: 2 g/h via INTRAVENOUS
  Filled 2017-12-26: qty 40
  Filled 2017-12-26: qty 500

## 2017-12-26 MED ORDER — LACTATED RINGERS IV SOLN
INTRAVENOUS | Status: DC
  Administered 2017-12-26 (×2): via INTRAVENOUS

## 2017-12-26 MED ORDER — SENNOSIDES-DOCUSATE SODIUM 8.6-50 MG PO TABS
2.0000 | ORAL_TABLET | ORAL | Status: DC
  Administered 2017-12-27: 2 via ORAL
  Filled 2017-12-26: qty 2

## 2017-12-26 MED ORDER — ONDANSETRON HCL 4 MG PO TABS: 4.0000 mg | ORAL_TABLET | ORAL | Status: DC | PRN

## 2017-12-26 MED ORDER — DIBUCAINE 1 % RE OINT: 1.0000 "application " | TOPICAL_OINTMENT | RECTAL | Status: DC | PRN

## 2017-12-26 MED ORDER — ACETAMINOPHEN 325 MG PO TABS: 650.0000 mg | ORAL_TABLET | ORAL | Status: DC | PRN

## 2017-12-26 MED ORDER — IBUPROFEN 600 MG PO TABS
600.0000 mg | ORAL_TABLET | Freq: Four times a day (QID) | ORAL | Status: DC
  Administered 2017-12-26 – 2017-12-27 (×5): 600 mg via ORAL
  Filled 2017-12-26 (×5): qty 1

## 2017-12-26 MED ORDER — LABETALOL HCL 100 MG PO TABS
100.0000 mg | ORAL_TABLET | Freq: Two times a day (BID) | ORAL | Status: DC
  Administered 2017-12-26 – 2017-12-27 (×2): 100 mg via ORAL
  Filled 2017-12-26 (×2): qty 1

## 2017-12-26 MED ORDER — SIMETHICONE 80 MG PO CHEW: 80.0000 mg | CHEWABLE_TABLET | ORAL | Status: DC | PRN

## 2017-12-26 MED ORDER — DIPHENHYDRAMINE HCL 25 MG PO CAPS: 25.0000 mg | ORAL_CAPSULE | Freq: Four times a day (QID) | ORAL | Status: DC | PRN

## 2017-12-26 NOTE — Progress Notes (Signed)
Post Partum Day 1 Subjective: no complaints Magensiumn Sulfate infusing . Blood pressures elevated but stable Objective: Blood pressure (!) 141/92, pulse 73, temperature 98.2 F (36.8 C), temperature source Oral, resp. rate 16, height 5\' 4"  (1.626 m), weight 159 lb (72.1 kg), SpO2 98 %, unknown if currently breastfeeding.  Physical Exam:  General: alert, cooperative and appears stated age Lochia: appropriate Uterine Fundus: firm Incision:  DVT Evaluation: No evidence of DVT seen on physical exam.  Recent Labs    12/26/17 0223 12/26/17 0529  HGB 13.4 13.4  HCT 38.9 38.6   Temp:  [97.7 F (36.5 C)-98.2 F (36.8 C)] 98.2 F (36.8 C) (02/02 1625) Pulse Rate:  [73-104] 73 (02/02 1625) Resp:  [16-20] 16 (02/02 1625) BP: (111-165)/(64-106) 141/92 (02/02 1625) SpO2:  [96 %-100 %] 98 % (02/02 1625) Weight:  [159 lb (72.1 kg)] 159 lb (72.1 kg) (02/01 2014) Assessment/Plan: Plan for discharge tomorrow  Bottle feeding   LOS: 1 day   Dailyn Kempner A Janisha Bueso CNM 12/26/2017, 5:20 PM

## 2017-12-26 NOTE — Anesthesia Postprocedure Evaluation (Signed)
Anesthesia Post Note  Patient: Desiree Watson  Procedure(s) Performed: AN AD HOC LABOR EPIDURAL     Patient location during evaluation: Mother Baby Anesthesia Type: Epidural Level of consciousness: awake Pain management: satisfactory to patient Vital Signs Assessment: post-procedure vital signs reviewed and stable Respiratory status: spontaneous breathing Cardiovascular status: stable Anesthetic complications: no    Last Vitals:  Vitals:   12/26/17 0512 12/26/17 0802  BP: (!) 150/91 (!) 140/95  Pulse: 81 82  Resp: 16 16  Temp:  36.8 C  SpO2: 98% 99%    Last Pain:  Vitals:   12/26/17 0802  TempSrc: Oral  PainSc:    Pain Goal:                 [employer],[place]

## 2017-12-27 MED ORDER — IBUPROFEN 600 MG PO TABS: 600.0000 mg | ORAL_TABLET | Freq: Four times a day (QID) | ORAL | 0 refills | Status: DC

## 2017-12-27 MED ORDER — LABETALOL HCL 100 MG PO TABS
100.0000 mg | ORAL_TABLET | Freq: Two times a day (BID) | ORAL | 1 refills | Status: DC
Start: 2017-12-27 — End: 2018-07-12

## 2017-12-27 NOTE — Progress Notes (Signed)
Post Partum Day 2 Subjective: Pt resting comfortably in bed.  No F/C/CP/SOB.  No headache, no blurry vision or RUQ pain.  Tolerating gen diet.  +flatus, +BM.  Moderate lochia.  No acute complaints this am.  Objective: Blood pressure 110/67, pulse 73, temperature 98.4 F (36.9 C), temperature source Oral, resp. rate 17, height 5\' 4"  (1.626 m), weight 72.1 kg (159 lb), SpO2 99 %, unknown if currently breastfeeding.  BP range: 109-161/66-98  Physical Exam:  General: alert, cooperative and appears stated age  CV: RRR Lungs: CTAB Lochia: appropriate Uterine Fundus: firm, non-tender, below umbilicus DVT Evaluation: No evidence of DVT seen on physical exam.  CBC Latest Ref Rng & Units 12/26/2017 12/26/2017 12/25/2017  WBC 4.0 - 10.5 K/uL 16.3(H) 18.4(H) 8.0  Hemoglobin 12.0 - 15.0 g/dL 16.113.4 09.613.4 04.514.6  Hematocrit 36.0 - 46.0 % 38.6 38.9 42.6  Platelets 150 - 400 K/uL 186 178 199     Assessment/Plan: 27yo G2P1103 s/p NSVD X2 complicated by preeclampsia, PPD#2  -Preeclampsia  S/p Magensium, started on Labetalol 100mg  bid  Currently asymptomatic -Meeting postpartum milestones appropriately, plan for discharge home today  Myna HidalgoJennifer Kanishk Stroebel, DO [phone] (cell) [phone] (office)

## 2017-12-27 NOTE — Discharge Instructions (Signed)
Sinh n? qua ???ng m ??o Vaginal Delivery Sinh n? qua ???ng m ??o c ngh?a l qu v? sinh con b?ng cch ??y em b ra kh?i ?ng ?? (m ??o). ??i ng? nhn vin y t? s? tr? gip qu v? tr??c, trong v sau khi sinh n? qua ???ng m ??o. Tr?i nghi?m sinh con l duy nh?t ??i v?i m?i ng??i ph? n? v m?i l?n mang thai v tr?i nghi?m sinh con s? thay ??i ty thu?c vo n?i m qu v? ch?n ?? sinh n?. Ti c?n lm g ?? chu?n b? cho s? ra ??i c?a em b? Tr??c khi em b c?a qu v? ???c sinh ra, ?i?u quan tr?ng l qu v? trao ??i v?i chuyn gia ch?m [place] s?c kh?e v?:  Cc ?u tin chuy?n d? v sinh n? c?a qu v?. Nh?ng y?u t? ny c th? bao g?m: ? Cc thu?c m qu v? c th? ???c cho dng. ? Qu v? s? qu?n l c?n ?au nh? th? no. ?i?u ny c th? bao g?m cc k? thu?t gi?m ?au khng dng thu?c ho?c gi?m ?au b?ng thu?c tim ch?ng h?n nh? thu?c gy t ngoi mng c?ng. ? Qu v? v em b s? ???c theo di nh? th? no trong khi chuy?n d? v sinh n?. ? Ai c th? ? trong phng sinh v?i qu v?. ? Cc c?m gic c?a qu v? v? vi?c sinh m? (m? l?y thai, hay m? b?t con) n?u vi?c ny tr? nn c?n thi?t. ? Cc c?m gic c?a qu v? v? vi?c nh?n mu hi?n thng qua ???ng truy?n t?nh m?ch (truy?n mu) n?u vi?c ny tr? nn c?n thi?t.  Qu v? c th? lm nh?ng vi?c ny hay khng: ? Ch?p hnh ho?c quay video lc sinh. ? ?n trong khi chuy?n d? v sinh. ? Di chuy?n xung quanh, ?i l?i ho?c thay ??i t? th? trong khi chuy?n d? v sinh n?.  C?n d? ki?n ?i?u g sau khi em b ???c sinh ra, ch?ng h?n nh?: ? C tr hon k?p v c?t dy r?n hay khng. ? Ai s? ch?m [place] cho em b ngay sau khi sinh ra. ? Cc thu?c ho?c xt nghi?m c th? ???c khuy?n ngh? cho em b. ? B?nh vi?n ho?c trung tm h? sinh c h? tr? cho em b b hay khng. ? Qu v? s? n?m t?i b?nh vi?n ho?c trung tm h? sinh trong bao lu.  B?t k? tnh tr?ng b?nh l no m qu v? c s? c th? ?nh h??ng ??n em b ho?c tr?i nghi?m chuy?n d? v sinh n? c?a qu v? nh? th? no.  ?? chu?n b? cho  s? ra ??i c?a em b, qu v? c?ng c?n:  ??n t?t c? cc bu?i khm ch?m [place] s?c kh?e tr??c khi sinh n? (khm tr??c khi sinh) theo khuy?n ngh? c?a chuyn gia ch?m  s?c kh?e. ?i?u ny c vai tr quan tr?ng.  Chu?n b? nh c?a cho s? xu?t hi?n c?a em b. ??m b?o qu v? c: ? T lt. ? Qu?n o em b. ? D?ng c? cho b. ? B? tr ch? ng? an ton cho qu v? v em b.  L?p gh? ng?i  t cho tr? em trong xe c?a qu v?. Nh? m?t ng??i l?p ??t gh? ng?i  t cho tr? em c ch?ng nh?n ki?m tra gh? ng?i  t cho tr? em c?a qu v? ?? ??m b?o n ? ???c l?p ??t an ton.  Ngh?  v? vi?c ai s? gip qu v? v em b ? nh trong t nh?t vi tu?n ??u tin sau khi sinh.  Ti c th? d? ki?n ?i?u g khi v? ??n trung tm h? sinh ho?c b?nh vi?n? Khi qu v? ?ang chuy?n d? v vo b?nh vi?n ho?c trung tm h? sinh, chuyn gia ch?m [place] s?c kh?e c th?:  Xem xt ti?n s? mang [place] c?a qu v? v b?t k? lo ng?i no qu v? c.  ??t m?t ???ng truy?n t?nh m?ch vo m?t trong cc t?nh m?ch c?a qu v?. ???ng truy?n ny ???c dng ?? cung c?p d?ch v thu?c cho qu v?.  Ki?m tra huy?t p, m?ch, nhi?t ?? v nh?p tim (cc d?u hi?u sinh t?n) c?a qu v?.  Ki?m tra xem ti n??c ?i (ti ?i) c?a qu v? ? v? (rch) ch?a.  Trao ??i v?i qu v? v? k? ho?ch sinh n? v th?o lu?n cc ph??ng n ki?m sot ?au.  Theo di Chuyn gia ch?m [employer] s?c kh?e c th? theo di cc c?n co bp (theo di t? cung) c?a qu v? v nh?p tim c?a em b (theo di [place] nhi). Qu v? c th? c?n ???c theo di:  Th??ng xuyn, nh?ng khng lin t?c (cch qung).  Ton b? th?i gian hay trong th?i gian di (lin t?c). Theo di lin t?c c th? c?n thi?t n?u: ? Qu v? ?ang dng m?t s? thu?c nh?t ??nh, ch?ng h?n nh? thu?c gi?m ?au ho?c thu?c lm cho t? cung co bp m?nh h?n. ? Qu v? c bi?n ch?ng [place] k? ho?c chuy?n d?.  Theo di c th? ???c th?c hi?n b?ng cch:  ??t m?t ?ng nghe ??c bi?t ho?c thi?t b? theo di c?m tay ln b?ng c?a qu v? ?? ki?m tra nh?p tim c?a em b v c?m  nh?n cc c?n co bp ? b?ng qu v?. Ph??ng php theo di ny khng lin t?c ghi l?i nh?p tim em b ho?c cc c?n co bp c?a qu v?.  ??t cc my theo di ln b?ng qu v? (my theo di bn ngoi) ?? ghi l?i nh?p tim c?a em b v t?n su?t v th?i gian ko di c?a cc c?n co bp. Qu v? c th? khng ph?i lc no c?ng mang my theo di bn ngoi.  ??t cc my theo di v trong t? cung qu v? (my theo di bn trong) ?? ghi l?i nh?p tim c?a em b v t?n su?t v th?i gian ko di v ?? m?nh c?a cc c?n co bp. ? Chuyn gia ch?m [place] s?c kh?e c th? s? d?ng cc my theo di bn trong n?u v? ? c?n thm thng tin v? ?? m?nh c?a cc c?n co bp c?a qu v? ho?c nh?p tim c?a em b. ? Cc my theo di bn trong ???c ??t b?ng cch lu?n m?t dy linh ho?t m?nh qua m ??o v vo trong t? cung c?a qu v?. Ty thu?c vo lo?i my theo di, n c th? n?m trong t? cung ho?c ??u em b cho ??n khi sinh. ? Chuyn gia ch?m [place] s?c kh?e s? th?o lu?n v? nh?ng l?i ch v r?i ro c?a vi?c s? d?ng m?t my theo di bn trong v h?i xem qu v? c ??ng  khng tr??c khi ??a my theo di vo trong.  ?o t? xa. ?y l ki?u theo di lin t?c c th? ???c th?c hi?n b?ng cc my theo di bn ngoi ho?c bn trong.  Thay v ph?i n?m trn gi??ng, qu v? c th? di chuy?n xung quan trong khi ?o t? xa. Hy h?i chuyn gia ch?m [place] s?c kh?e xem qu v? c th? l?a ch?n ?o t? xa khng.  Khm th?c th? Chuyn gia ch?m [place] s?c kh?e c th? khm th?c th? cho qu v?. Vi?c ny c th? bao g?m:  Ki?m tra xem em b c n?m ? t? th?: ? ??u h??ng v? pha m ??o c?a qu v? (??u quay xu?ng). ?y l t? th? th??ng g?p nh?t. ? ??u h??ng v? pha ??nh t? cung c?a qu v? (??u quay ln hay ngi mng). N?u em b ? t? th? ngi mng, chuyn gia ch?m [place] s?c kh?e c th? th? [place] em b sang t? th? ??u quay xu?ng ?? qu v? c th? sinh qua ???ng m ??o. N?u khng c v? r?ng em b c th? ???c sinh qua ???ng m ??o, chuyn gia ch?m [place] s?c kh?e c th? khuy?n ngh? ph?u thu?t ?? sinh b.  Trong cc tr??ng h?p hi?m g?p, qu v? c th? sinh qua ???ng m ??o n?u em b ? t? th? ??u quay ln (sinh ngi mng). ? N?m ngang b?ng (ngang). Cc b n?m ngang b?ng khng th? sinh qua ???ng m ??o.  Ki?m tra c? t? cung c?a qu v? ?? xc ??nh: ? Xem c? t? cung c ?ang m?ng h?n (m? ?i) hay khng. ? Xem c? t? cung c ?ang m? ra (gin ra) hay khng. ? Em b ? di chuy?n su ??n m?c no vo ?ng ??.  Ba giai ?o?n chuy?n d? v sinh n? l g?  Chuy?n d? v sinh n? bnh th??ng ???c chia thnh ba giai ?o?n sau ?y: [place] ?o?n 1  Giai ?o?n 1 l giai ?o?n di nh?t c?a qu trnh chuy?n d? v n c th? ko di nhi?u gi? hay ngy. Giai ?o?n 1 bao g?m: ? Chuy?n d? s?m. ?y l khi cc c?n co bp c th? khng ??u ho?c ??u v nh?Tera Mater th??ng, cc c?n co bp chuy?n d? s?m th??ng cch nhau h?n 10 pht. ? Chuy?n d? tch c?c. ?y l khi cc c?n co bp tr? nn lu h?n, ??u h?n, th??ng xuyn h?n v m?nh h?n. ? Giai ?o?n chuy?n ti?p. ?y l khi cc c?n co bp x?y ra r?t g?n nhau, r?t m?nh v c th? ko di lu h?n trong cc giai ?o?n chuy?n d? khc.  Cc c?n co bp th??ng c?m th?y nh?, khng th??ng xuyn v khng ??u vo lc ??u. Cc c?n co tr? nn m?nh h?n, th??ng xuyn h?n (kho?ng m?i 2-3 pht m?t l?n) v ??u h?n khi qu v? chuy?n t? giai ?o?n chuy?n d? s?m sang giai ?o?n chuy?n d? tch c?c v chuy?n ti?p.  Nhi?u ph? n? tr?i qua giai ?o?n 1 m?t cch t? nhin, nh?ng qu v? c th? c?n tr? gip ?? ti?p t?c qu trnh. N?u ?i?u ny x?y ra, chuyn gia ch?m [place] s?c kh?e c th? trao ??i v?i qu v? v? vi?c: ? R?ch ti ?i n?u ti ?i ch?a v?. ? Cho qu v? dng thu?c ?? lm cho cc c?n co bp m?nh h?n v th??ng xuyn h?n.  Giai ?o?n 1 k?t thc khi c? t? cung gin ra hon ton ??n 4 inch (10 cm) v hon ton m? ra. ?i?u ny x?y ra ? cu?i giai ?o?n chuy?n ti?p. Giai ?o?n 2  Khi c? t? cung gin v m? hon ton ??  n 4 inch (10 cm), qu v? c th? b?t ??u c c?m gic mu?n ??y ra. Thng th??ng c? th? s? t? nhin ngh? ng?i tr??c khi  c?m th?y mu?n ??y ra, ??c bi?t l n?u qu v? ? nh?n thu?c gy t ngoi mng c?ng ho?c m?t s? thu?c gi?m ?au khc. Th?i gian ngh? ng?i ny c th? ko di ??n 1-2 gi?, ty thu?c vo tr?i nghi?m chuy?n d? duy nh?t c?a qu v?.  Trong giai ?o?n 2, cc c?n co bp th??ng t ?au h?n, b?i v vi?c ??y ra gip gi?m ?au do co bp. Thay v ?au do co bp, qu v? c th? c?m th?y c?ng t?c v ?au rt, ??c bi?t l khi ph?n r?ng nh?t c?a ??u em b ?i qua c?a m ??o (c? m ??o).  Chuyn gia ch?m [place] s?c kh?e s? theo di ch?t ch? qu trnh ??y ra c?a qu v? v qu trnh em b ?i ra thng qua m ??o trong giai ?o?n 2.  Chuyn gia ch?m [place] s?c kh?e c th? [place]oa bp khu v?c da gi?a c?a m ??o v h?u mn (vng ?y ch?u) ho?c ?n nh? vo vng ?y ch?u c?a qu v?. Vi?c ny gip ?y ch?u gin ra khi ??u em b b?t ??u ra ??n c? m ??o, c th? gip ng?n tnh tr?ng rch vng ?y ch?u. ? Trong m?t s? tr??ng h?p, m?t v?t m? c th? ???c r?ch ? vng ?y ch?u (th? thu?t c?t t?ng sinh mn) ?? cho php em b chui ra qua l? m ??o. Th? thu?t c?t t?ng sinh mn gip lm cho l? m ??o r?ng h?n nh?m t?o thm ch? cho em b chui qua.  Vi?c r?t quan tr?ng l th? v t?p trung ?? chuyn gia ch?m [place] s?c kh?e c th? ki?m sot qu trnh ?i ra c?a ??u em b. Chuyn gia ch?m [place] s?c kh?e c th? cho qu v? gi?m c??ng ?? ??y, nh?m gip ng?n ng?a rch vng ?y ch?u.  Sau khi ??u em b ra ngoi, hai vai v ph?n cn l?i c?a c? th? th??ng s? ?i ra r?t nhanh m khng c kh kh?n g.  Khi em b ???c sinh ra, dy r?n c th? ???c c?t ngay l?p t?c ho?c c th? tr hon 1-2 pht, ty thu?c vo s?c kh?e c?a b. ?i?u ny c th? khc nhau gi?a cc chuyn gia ch?m [place] s?c kh?e, b?nh vi?n v trung tm h? sinh.  N?u qu v? v em b ??u ?? kh?e, b c th? ???c ??t ln ng?c ho?c b?ng qu v? ?? duy tr thn nhi?t c?a b v gip hai ng??i k?t n?i v?i nhau. M?t s? b m? v em b b?t ??u cho b vo lc ny. Nhm ch?m [place] s?c kh?e c?a qu v? s? lau kh em b v gip gi? ?m cho b  trong su?t th?i gian ny.  Em b c th? c?n ???c ch?m [place] ngay n?u b: ? C d?u hi?u ki?t s?c trong qu trnh chuy?n d?. ? C m?t tnh tr?ng b?nh l. ? ???c sinh ra qu s?m (sinh non). ? ? ??i ti?n tr??c khi sinh (phn su). ? C cc d?u hi?u kh chuy?n t? vi?c n?m trong t? cung sang n?m ngoi t? cung. N?u qu v? d? ??nh cho con b, nhm ch?m [place] s?c kh?e s? gip qu v? b?t ??u cho b b. Giai ?o?n 3  Giai ?o?n ba c?a qu trnh chuy?n d? b?t ??u  ngay sau khi qu v? sinh em b v k?t thc sau khi qu v? ??y nhau [place] ra. Nhau thai l m?t c? quan pht tri?n trong su?t [place]? ?? cung c?p  xi v ch?t dinh d??ng cho em b trong t? cung.  ??y nhau [place] ra c th? c?n ph?i ??y v qu v? c th? c cc c?n co bp nh?Theodoro Grist. Cho con b c th? kch thch cc c? co bp nh?m gip qu v? ??y nhau [place] ra.  Sau khi nhau thai ???c ??y ra, t? cung ph?i bp l?i (co l?i) v tr? nn r?n ch?c. ?i?u ny gip ng?ng ch?y mu trong t? cung. ?? gip t? cung co l?i v ki?m sot ch?y mu, chuyn gia ch?m [place] s?c kh?e c th?: ? Cho qu v? dng thu?c b?ng cch tim, truy?n qua ???ng truy?n t?nh m?ch, u?ng ho?c ??t ? tr?c trng (qua tr?c trng). ? [place] bp b?ng c?a qu v? ho?c ti?n hnh khm m ??o ?? lo?i b? b?t k? c?c mu ?ng no cn l?i trong t? cung c?a qu v?. ? Rt h?t n??c ti?u trong bng quang c?a qu v? b?ng cch ??t m?t ?ng m?m, m?nh (?ng thng) vo bng quang c?a qu v?. ? Khuy?n khch qu v? cho con b. Sau khi sinh xong, qu v? v em b s? ???c theo di ch?t ch? ?? ??m b?o r?ng c? hai ??u kh?e m?nh cho ??n khi s?n sng v? nh. Nhm ch?m [place] s?c kh?e s? h??ng d?n qu v? cch ch?m [place] b?n thn v em b. Thng tin ny khng nh?m m?c ?ch thay th? cho l?i khuyn m chuyn gia ch?m [place] s?c kh?e ni v?i qu v?. Hy b?o ??m qu v? ph?i th?o lu?n b?t k? v?n ?? g m qu v? c v?i chuyn gia ch?m [place] s?c kh?e c?a qu v?. Document Released: 12/07/2015 Document Revised: 02/27/2017 Document Reviewed: 11/25/2015 Elsevier  Interactive Patient Education  2018 [employer]

## 2017-12-27 NOTE — Progress Notes (Signed)
Discharge instructions reviewed with patient.  Patient states understanding of home care for self and babies, medications, activity, signs/symptoms to report to MD and return MD office visit for self and babies..  Patients significant other and family will assist with her care @ home.  No home  equipment needed, patient has prescriptions and all personal belongings.  Patient ambulated for discharge in stable condition with staff without incident.

## 2017-12-28 ENCOUNTER — Ambulatory Visit (HOSPITAL_COMMUNITY)
Admission: RE | Admit: 2017-12-28 | Discharge: 2017-12-28 | Disposition: A | Discharge status: Home / Self Care | Payer: BLUE CROSS/BLUE SHIELD | Source: Ambulatory Visit | Attending: Obstetrics & Gynecology | Admitting: Obstetrics & Gynecology

## 2017-12-28 ENCOUNTER — Encounter (HOSPITAL_COMMUNITY): Payer: Self-pay

## 2017-12-30 ENCOUNTER — Inpatient Hospital Stay (HOSPITAL_COMMUNITY): Admission: RE | Admit: 2017-12-30 | Payer: BLUE CROSS/BLUE SHIELD | Source: Ambulatory Visit

## 2017-12-30 NOTE — Discharge Summary (Signed)
OB Discharge Summary     Patient Name: Desiree Watson DOB: 1991-05-20 MRN: [id number]  Date of admission: 12/25/2017 Delivering MD:    Jetta Lout, Leigh Aurora [[id number]]  [facility], JESSICA   Forde Radon [[id number]]  [facility], JESSICA  and Dr. Myna Hidalgo  Date of discharge: 12/27/2017  Admitting diagnosis: 36wks twins, bp check Intrauterine pregnancy: [redacted]w[redacted]d     Secondary diagnosis:  Active Problems:   Indication for care or intervention related to labor and delivery   SVD (spontaneous vaginal delivery)   First degree perineal laceration during delivery   Twin birth, born in hospital, delivered   Periurethral abrasion, delivered, current hospitalization   Preeclampsia, unspecified trimester   Goiter  Additional problems: as above     Discharge diagnosis: Preterm Pregnancy Delivered and Preeclampsia (mild)                                                                                                Post partum procedures:none  Augmentation: Pitocin  Complications: None  Hospital course:  Induction of Labor With Vaginal Delivery   27 y.o. yo [id number] at [redacted]w[redacted]d was admitted to the hospital 12/25/2017 for induction of labor.  Indication for induction: preeclampsia.  Patient had an uncomplicated labor course as follows: Membrane Rupture Time/Date:    Windell Norfolk [[id number]]  9:34 PM   K Dorothyann Peng [[id number]]  9:34 PM ,   K Kyra Leyland [[id number]]  12/25/2017   Forde Radon [[id number]]  12/25/2017   Intrapartum Procedures: Episiotomy:    Windell [place] [[id number]]  None [1]   Forde Radon [[id number]]  None [1]                                         Lacerations:     Windell [place] [[id number]]  1st degree [2];Periurethral [8];Perineal [11]   Forde Radon [[id number]]  1st degree [2];Periurethral [8];Perineal [11]  Patient had delivery of a Viable infant.  Information for the patient's newborn:  Windell [place] [[id number]]  Delivery Method: Vaginal, Spontaneous(Filed from Delivery  Summary) Information for the patient's newborn:  Forde Radon [[id number]]  Delivery Method: Vaginal, Spontaneous(Filed from Delivery Summary)     Windell [place] [[id number]]  12/25/2017   Forde Radon [[id number]]  12/25/2017  Details of delivery can be found in separate delivery note. Due to preeclampsia, she was continued on Magnesium for 24hrs.  Due to elevated blood pressure,she was started on Labetalol 100mg  twice daily. Patient is discharged home 12/27/17.  Physical exam  Vitals:   12/27/17 0127 12/27/17 0406 12/27/17 0929 12/27/17 1007  BP: 109/66 110/67 112/63 118/74  Pulse: 77 73 85 86  Resp: 16 17 16    Temp:  98.4 F (36.9 C) 98.8 F (37.1 C)   TempSrc:  Oral Oral   SpO2: 98% 99% 97%   Weight:      Height:       General: alert, cooperative and no distress Lochia: appropriate Uterine Fundus: firm Incision:  N/A DVT Evaluation: No evidence of DVT seen on physical exam. Labs: Lab Results  Component Value Date   WBC 16.3 (H) 12/26/2017   HGB 13.4 12/26/2017   HCT 38.6 12/26/2017   MCV 83.7 12/26/2017   PLT 186 12/26/2017   CMP Latest Ref Rng & Units 04/15/2015  Glucose 65 - 99 mg/dL 80  BUN 6 - 20 mg/dL 11  Creatinine 1.610.44 - 0.961.00 mg/dL 0.450.71  Sodium 409135 - 811145 mmol/L 133(L)  Potassium 3.5 - 5.1 mmol/L 4.3  Chloride 101 - 111 mmol/L 100(L)  CO2 22 - 32 mmol/L 25  Calcium 8.9 - 10.3 mg/dL 9.1  Total Protein 6.5 - 8.1 g/dL 7.1  Total Bilirubin 0.3 - 1.2 mg/dL 9.1(Y0.2(L)  Alkaline Phos 38 - 126 U/L 210(H)  AST 15 - 41 U/L 26  ALT 14 - 54 U/L 24    Discharge instruction: per After Visit Summary and "Baby and Me Booklet".  After visit meds:  Allergies as of 12/27/2017   No Known Allergies     Medication List    TAKE these medications   ibuprofen 600 MG tablet Commonly known as:  ADVIL,MOTRIN Take 1 tablet (600 mg total) by mouth every 6 (six) hours.   labetalol 100 MG tablet Commonly known as:  NORMODYNE Take 1 tablet (100 mg total) by mouth 2 (two) times  daily.   prenatal multivitamin Tabs tablet Take 1 tablet by mouth daily at 12 noon.       Diet: routine diet  Activity: Advance as tolerated. Pelvic rest for 6 weeks.   Outpatient follow up:2 weeks Follow up Appt: Future Appointments  Date Time Provider Department Center  01/12/2018  9:00 AM [facility] None  01/15/2018  9:00 AM Reather LittlerKumar, Ajay, MD [facility] None   Follow up Visit:No Follow-up on file.  Postpartum contraception: Not Discussed  Newborn Data:   Windell NorfolkK Lin, Vina [[id number]][[id number]]  Live born female  Birth Weight: 5 lb 0.1 oz (2270 g) APGAR: 9, 9  Newborn Delivery   Birth date/time:  12/25/2017 23:42:00 Delivery type:  Vaginal, Spontaneous      Forde RadonK Lin, Valerie [[id number]][[id number]]  Live born female  Birth Weight: 5 lb 6.8 oz (2460 g) APGAR: 8, 9  Newborn Delivery   Birth date/time:  12/25/2017 23:47:00 Delivery type:  Vaginal, Spontaneous     Baby Feeding: Breast Disposition:rooming in   12/30/2017 Sharon SellerJennifer M Grethel Zenk, DO

## 2017-12-31 ENCOUNTER — Inpatient Hospital Stay (HOSPITAL_COMMUNITY): Payer: BLUE CROSS/BLUE SHIELD

## 2018-01-01 DIAGNOSIS — O139 Gestational [pregnancy-induced] hypertension without significant proteinuria, unspecified trimester: Secondary | ICD-10-CM | POA: Diagnosis not present

## 2018-01-03 ENCOUNTER — Encounter (HOSPITAL_COMMUNITY): Payer: Self-pay | Admitting: Obstetrics and Gynecology

## 2018-01-03 ENCOUNTER — Inpatient Hospital Stay (HOSPITAL_COMMUNITY)
Admission: AD | Admit: 2018-01-03 | Discharge: 2018-01-03 | Disposition: A | Discharge status: Home / Self Care | Payer: BLUE CROSS/BLUE SHIELD | Source: Ambulatory Visit | Attending: Obstetrics and Gynecology | Admitting: Obstetrics and Gynecology

## 2018-01-03 DIAGNOSIS — E049 Nontoxic goiter, unspecified: Secondary | ICD-10-CM | POA: Insufficient documentation

## 2018-01-03 DIAGNOSIS — O135 Gestational [pregnancy-induced] hypertension without significant proteinuria, complicating the puerperium: Secondary | ICD-10-CM | POA: Diagnosis not present

## 2018-01-03 DIAGNOSIS — O1003 Pre-existing essential hypertension complicating the puerperium: Secondary | ICD-10-CM | POA: Diagnosis not present

## 2018-01-03 NOTE — MAU Note (Signed)
Patient presents for BP check, denies headache, no blurred vision, no epigastric pain.

## 2018-01-03 NOTE — MAU Note (Signed)
Notified V. Latham CNM of BP readings will see patient in triage.

## 2018-01-03 NOTE — MAU Provider Note (Signed)
History   27 yo [id number] s/p twin delivery on 12/25/17, s/p induction for IUGR and pre-eclampsia, presented today for BP check.  Treated after delivery with 24 hours of magnesium, home on Labetalol 100 mg po BID.    in office 01/01/18 by Dr. Su Hilt, with BP 142/104, facial edema, no subjective complaints.  Dr. Su Hilt had drawn repeat PIH labs/PCR, and instructed patient to take Labetalol 400 mg po BID.  PIH labs were resulted last night, with AST/ALT 37 and 70, PCR 0.75.  Dr. Su Hilt requested we contact patient last night, with possibility of readmitting her for repeat Mag course.  Patient's husband was contacted late last night, and he advised he would bring her here today.  Per my consult with Dr. Estanislado Pandy today, plan made for repeat BP check in MAU, and determination of f/u plan from there.  Patient denies HA, visual sx, or epigastric pain.  She has still been taking the Labetalol 100 mg po BID (not 400 BID).  Reports babies are doing well.  Patient Active Problem List   Diagnosis Date Noted  . SVD (spontaneous vaginal delivery) 12/26/2017  . First degree perineal laceration during delivery 12/26/2017  . Twin birth, born in hospital, delivered 12/26/2017  . Periurethral abrasion, delivered, current hospitalization 12/26/2017  . Preeclampsia, unspecified trimester 12/26/2017  . Goiter 12/26/2017  . Indication for care or intervention related to labor and delivery 12/25/2017  . Gestational hypertension 12/09/2017  . Twin pregnancy--di/di 12/09/2017  . Language barrier 04/15/2015    Chief Complaint  Patient presents with  . Hypertension   HPI:  As above  OB History    Gravida Para Term Preterm AB Living   2 2 1 1   3    SAB TAB Ectopic Multiple Live Births         1 3      Past Medical History:  Diagnosis Date  . Medical history non-contributory   . Thyroid disease     Past Surgical History:  Procedure Laterality Date  . NO PAST SURGERIES      Family History  Problem  Relation Age of Onset  . Thyroid disease Neg Hx   . Cancer Neg Hx   . Diabetes Neg Hx     Social History   Tobacco Use  . Smoking status: Never Smoker  . Smokeless tobacco: Never Used  Substance Use Topics  . Alcohol use: No  . Drug use: No    Allergies: No Known Allergies  Medications Prior to Admission  Medication Sig Dispense Refill Last Dose  . ibuprofen (ADVIL,MOTRIN) 600 MG tablet Take 1 tablet (600 mg total) by mouth every 6 (six) hours. 30 tablet 0   . labetalol (NORMODYNE) 100 MG tablet Take 1 tablet (100 mg total) by mouth 2 (two) times daily. 60 tablet 1   . Prenatal Vit-Fe Fumarate-FA (PRENATAL MULTIVITAMIN) TABS tablet Take 1 tablet by mouth daily at 12 noon.   Past Week at Unknown time    ROS:  No sx. Physical Exam   Blood pressure (!) 145/89, pulse 88, resp. rate 16, unknown if currently breastfeeding.  Physical Exam: Chest clear Heart RRR without murmur Abd soft, NT Fundus approx 14 week size, NT Lochia scant per patient report Pelvic--deferred Ext DTR 2+, no clonus, tr edema.    ED Course  Assessment: PP x 9 days, s/p twin delivery, IUGR, pre-eclampsia  Plan: Consulted with Dr. Estanislado Pandy. Increase Labetalol to 200 mg BID--reviewed this plan with patient and husband, with return demonstration  of taking 2 100 mg tabs twice a day. PIH precautions reviewed. Keep scheduled f/u visit on 2/14 with Dr. Su Hiltoberts. F/u as needed with any issues.   Nigel BridgemanVicki Adlai Nieblas CNM, MSN 01/03/2018 11:07 AM

## 2018-01-07 DIAGNOSIS — I10 Essential (primary) hypertension: Secondary | ICD-10-CM | POA: Diagnosis not present

## 2018-01-12 ENCOUNTER — Other Ambulatory Visit (INDEPENDENT_AMBULATORY_CARE_PROVIDER_SITE_OTHER): Payer: BLUE CROSS/BLUE SHIELD

## 2018-01-12 DIAGNOSIS — E069 Thyroiditis, unspecified: Secondary | ICD-10-CM

## 2018-01-12 LAB — T4, FREE: FREE T4: 0.95 ng/dL (ref 0.60–1.60)

## 2018-01-12 LAB — TSH: TSH: 0.85 u[IU]/mL (ref 0.35–4.50)

## 2018-01-15 ENCOUNTER — Encounter: Payer: Self-pay | Admitting: Endocrinology

## 2018-01-15 ENCOUNTER — Ambulatory Visit: Payer: BLUE CROSS/BLUE SHIELD | Admitting: Endocrinology

## 2018-01-15 VITALS — BP 115/75 | HR 82 | Ht 64.0 in | Wt 139.6 lb

## 2018-01-15 DIAGNOSIS — E049 Nontoxic goiter, unspecified: Secondary | ICD-10-CM | POA: Diagnosis not present

## 2018-01-15 NOTE — Progress Notes (Signed)
Patient ID: Desiree Watson, female   DOB: December 05, 1990, 27 y.o.   MRN: [id number]              Reason for Appointment: Follow-up of thyroid    History of Present Illness:    History obtained on initial consultation in June: The patient's thyroid enlargement was first discovered in 2011 approximately, at that time she was having a sensation of something in her throat when swallowing She was apparently in [place] at that time and was seen by a local physician She does not know what diagnosis was made.  However she was given an unknown medication which she took for 3 years With this her local symptoms resolved and she was told to stop the medication after taking it for 3 years She has had no difficulty with swallowing, however she has a feeling of something stuck in her throat area when swallowing although it is not uncomfortable  After initial consultation in 6/18 she had findings of euthyroid goiter with insignificant nodules on initial evaluation and no evidence of Hashimoto's thyroiditis She was seen in early pregnancy in 8/18 for follow-up of her thyroid when she was complaining of a little more fatigue and general weakness she was having vomiting but she did not have hyperemesis She did not have any typical symptoms of hyperthyroidism with shakiness, palpitations, heat intolerance or significant weight loss Because of her significant hyperthyroidism and free T4 of 3.6 she was started on PTU 50 mg 3 times a day Thyrotropin antibody was negative  Since her thyrotropin receptor antibody was negative she was told to reduce her PTU dosage and eventually stopped in September  RECENT history:  It was felt that she had painless thyroiditis since her free T4 had gone down below normal in 07/2017  Patient has had normal thyroid levels subsequently during her pregnancy, last visit in 08/2017 She delivered normally about 3 weeks ago  She feels fairly good and has no difficulty swallowing, unusual  fatigue or choking She was having some discomfort in her throat recently but not now  Thyroid levels are again quite normal and free T4 which was relatively low is now back to normal  Wt Readings from Last 3 Encounters:  01/15/18 139 lb 9.6 oz (63.3 kg)  12/25/17 159 lb (72.1 kg)  12/21/17 158 lb 6.4 oz (71.8 kg)    Lab Results  Component Value Date   FREET4 0.95 01/12/2018   FREET4 0.68 09/15/2017   FREET4 0.58 (L) 08/03/2017   TSH 0.85 01/12/2018   TSH 0.51 09/15/2017   TSH 0.01 (L) 06/22/2017   Thyroid ultrasound as of 04/2017 showed a multinodular goiter with mostly mixed cystic/solid nodules up to 2 cm   Allergies as of 01/15/2018   No Known Allergies     Medication List        Accurate as of 01/15/18  9:13 AM. Always use your most recent med list.          ibuprofen 600 MG tablet Commonly known as:  ADVIL,MOTRIN Take 1 tablet (600 mg total) by mouth every 6 (six) hours.   labetalol 100 MG tablet Commonly known as:  NORMODYNE Take 1 tablet (100 mg total) by mouth 2 (two) times daily.   prenatal multivitamin Tabs tablet Take 1 tablet by mouth daily at 12 noon.       Allergies: No Known Allergies  Past Medical History:  Diagnosis Date  . Medical history non-contributory   . Thyroid disease  Past Surgical History:  Procedure Laterality Date  . NO PAST SURGERIES      Family History  Problem Relation Age of Onset  . Thyroid disease Neg Hx   . Cancer Neg Hx   . Diabetes Neg Hx     Social History:  reports that  has never smoked. she has never used smokeless tobacco. She reports that she does not drink alcohol or use drugs.    Review of Systems    Examination:   BP 115/75   Pulse 82   Ht 5\' 4"  (1.626 m)   Wt 139 lb 9.6 oz (63.3 kg)   SpO2 98%   BMI 23.96 kg/m            The thyroid is enlarged as before: Right lobe is enlarged about 2.5 times normal, slightly firm, isthmus is enlarged also.  It is relatively soft  Left lobe  enlarged about 1. 5-2 times normal, smooth and soft  Reflexes appear normal   Assessment/Plan:   Goiter , long-standing and she has a multinodular goiter without evidence of Hashimoto's thyroiditis on her previous evaluation No family history of thyroid disease available  Silent thyroiditis, resolved  Currently the patient is euthyroid and her goiter is not symptomatic also  Since she is just a few weeks postpartum she will be seen back in follow-up in 6 months    Reather Littlerjay Krishan Mcbreen 01/15/2018

## 2018-02-25 DIAGNOSIS — Z6827 Body mass index (BMI) 27.0-27.9, adult: Secondary | ICD-10-CM | POA: Diagnosis not present

## 2018-02-25 DIAGNOSIS — Z01419 Encounter for gynecological examination (general) (routine) without abnormal findings: Secondary | ICD-10-CM | POA: Diagnosis not present

## 2018-07-09 ENCOUNTER — Other Ambulatory Visit (INDEPENDENT_AMBULATORY_CARE_PROVIDER_SITE_OTHER): Payer: BLUE CROSS/BLUE SHIELD

## 2018-07-09 DIAGNOSIS — E049 Nontoxic goiter, unspecified: Secondary | ICD-10-CM | POA: Diagnosis not present

## 2018-07-09 LAB — TSH: TSH: 0.13 u[IU]/mL — AB (ref 0.35–4.50)

## 2018-07-12 ENCOUNTER — Encounter: Payer: Self-pay | Admitting: Endocrinology

## 2018-07-12 ENCOUNTER — Ambulatory Visit: Payer: BLUE CROSS/BLUE SHIELD | Admitting: Endocrinology

## 2018-07-12 VITALS — BP 126/86 | HR 76 | Ht 61.0 in | Wt 139.4 lb

## 2018-07-12 DIAGNOSIS — R7989 Other specified abnormal findings of blood chemistry: Secondary | ICD-10-CM | POA: Diagnosis not present

## 2018-07-12 DIAGNOSIS — E069 Thyroiditis, unspecified: Secondary | ICD-10-CM

## 2018-07-12 NOTE — Progress Notes (Signed)
Patient ID: Desiree Watson, female   DOB: 12/07/1990, 27 y.o.   MRN: [id number]              Reason for Appointment: Follow-up of thyroid    History of Present Illness:    History obtained on initial consultation in June: The patient's thyroid enlargement was first discovered in 2011 approximately, at that time she was having a sensation of something in her throat when swallowing She was apparently in [place] at that time and was seen by a local physician She does not know what diagnosis was made.  However she was given an unknown medication which she took for 3 years With this her local symptoms resolved and she was told to stop the medication after taking it for 3 years She has had no difficulty with swallowing, however she has a feeling of something stuck in her throat area when swallowing although it is not uncomfortable  After initial consultation in 6/18 she had findings of euthyroid goiter with insignificant nodules on initial evaluation and no evidence of Hashimoto's thyroiditis She was seen in early pregnancy in 8/18 for follow-up of her thyroid when she was complaining of a little more fatigue and general weakness she was having vomiting but she did not have hyperemesis She did not have any typical symptoms of hyperthyroidism with shakiness, palpitations, heat intolerance or significant weight loss Because of her significant hyperthyroidism and free T4 of 3.6 she was started on PTU 50 mg 3 times a day Thyrotropin antibody was negative  Since her thyrotropin receptor antibody was negative she was told to reduce her PTU dosage and eventually stopped in September  RECENT history:  MULTINODULAR goiter:  She had a history of silent thyroiditis 2018 during her pregnancy She had twin babies in 11/2017  She is here for follow-up She does not complain of any shakiness, palpitations, heat intolerance or unusual fatigue Weight has not changed Also had difficulty swallowing, local pressure  feeling in the neck or complaining of swelling  Even though her thyroid levels had gone back to normal in 10/18 she now has a slightly low TSH without increased free T4    Wt Readings from Last 3 Encounters:  07/12/18 139 lb 6.4 oz (63.2 kg)  01/15/18 139 lb 9.6 oz (63.3 kg)  12/25/17 159 lb (72.1 kg)    Lab Results  Component Value Date   FREET4 0.95 01/12/2018   FREET4 0.68 09/15/2017   FREET4 0.58 (L) 08/03/2017   TSH 0.13 (L) 07/09/2018   TSH 0.85 01/12/2018   TSH 0.51 09/15/2017   Thyroid ultrasound as of 04/2017 showed a multinodular goiter with mostly mixed cystic/solid nodules up to 2 cm   Allergies as of 07/12/2018   No Known Allergies     Medication List    as of 07/12/2018  8:29 AM   You have not been prescribed any medications.     Allergies: No Known Allergies  Past Medical History:  Diagnosis Date  . Medical history non-contributory   . Thyroid disease     Past Surgical History:  Procedure Laterality Date  . NO PAST SURGERIES      Family History  Problem Relation Age of Onset  . Thyroid disease Neg Hx   . Cancer Neg Hx   . Diabetes Neg Hx     Social History:  reports that she has never smoked. She has never used smokeless tobacco. She reports that she does not drink alcohol or use drugs.  Review of Systems    Examination:   BP 126/86 (BP Location: Right Arm, Patient Position: Sitting, Cuff Size: Normal)   Pulse 76   Ht 5\' 1"  (1.549 m)   Wt 139 lb 6.4 oz (63.2 kg)   SpO2 96%   BMI 26.34 kg/m           Right thyroid lobe is enlarged about 3 times normal, soft to firm, smooth and no distinct nodules felt  Left lobe enlarged about 1. 5-2 times normal, smooth and soft  Biceps reflexes are normal Skin not unusually warm No tremor   Assessment/Plan:  Multinodular, long-standing  No evidence of Hashimoto's thyroiditis on her previous evaluation  She has a previous history of silent thyroiditis which had resolved  She is  asymptomatic currently Again TSH is slightly low, about 8 months after delivery Thyroid enlargement is about the same as before, possibly a little larger on the right No distinct nodules noted  Since she may have silent thyroiditis again will have her come back in 6 weeks for follow-up but she will call if she has any unusual fatigue symptoms of palpitations or weight loss    Reather LittlerAjay Janiene Aarons 07/12/2018

## 2018-08-31 ENCOUNTER — Ambulatory Visit (INDEPENDENT_AMBULATORY_CARE_PROVIDER_SITE_OTHER): Payer: BLUE CROSS/BLUE SHIELD | Admitting: Endocrinology

## 2018-08-31 ENCOUNTER — Encounter: Payer: Self-pay | Admitting: Endocrinology

## 2018-08-31 DIAGNOSIS — R7989 Other specified abnormal findings of blood chemistry: Secondary | ICD-10-CM

## 2018-08-31 DIAGNOSIS — E069 Thyroiditis, unspecified: Secondary | ICD-10-CM | POA: Diagnosis not present

## 2018-08-31 LAB — TSH: TSH: 1.64 u[IU]/mL (ref 0.35–4.50)

## 2018-08-31 LAB — T3, FREE: T3 FREE: 3.4 pg/mL (ref 2.3–4.2)

## 2018-08-31 LAB — T4, FREE: FREE T4: 1.01 ng/dL (ref 0.60–1.60)

## 2018-08-31 NOTE — Progress Notes (Signed)
Patient ID: Desiree Watson, female   DOB: Nov 02, 1991, 27 y.o.   MRN: [id number]              Reason for Appointment: Follow-up of thyroid    History of Present Illness:    History obtained on initial consultation in June: The patient's thyroid enlargement was first discovered in 2011 approximately, at that time she was having a sensation of something in her throat when swallowing She was apparently in [place] at that time and was seen by a local physician She does not know what diagnosis was made.  However she was given an unknown medication which she took for 3 years With this her local symptoms resolved and she was told to stop the medication after taking it for 3 years She has had no difficulty with swallowing, however she has a feeling of something stuck in her throat area when swallowing although it is not uncomfortable  After initial consultation in 6/18 she had findings of euthyroid goiter with insignificant nodules on initial evaluation and no evidence of Hashimoto's thyroiditis She was seen in early pregnancy in 8/18 for follow-up of her thyroid when she was complaining of a little more fatigue and general weakness she was having vomiting but she did not have hyperemesis She did not have any typical symptoms of hyperthyroidism with shakiness, palpitations, heat intolerance or significant weight loss Because of her significant hyperthyroidism and free T4 of 3.6 she was started on PTU 50 mg 3 times a day Thyrotropin antibody was negative  Since her thyrotropin receptor antibody was negative she was told to reduce her PTU dosage and eventually stopped in September  RECENT history:  MULTINODULAR goiter:  She had a history of silent thyroiditis 2018 during her pregnancy She had delivered twin babies in 11/2017  On a routine follow-up visit her TSH in 8/19 was low but not undetectable without any symptoms of hyperthyroidism  She feels fairly good with her energy level and has no heat or  cold intolerance No significant weight change She does not have any local discomfort in her neck, may sometimes feel a fullness in her throat area but no difficulty swallowing  Labs pending   Wt Readings from Last 3 Encounters:  08/31/18 141 lb (64 kg)  07/12/18 139 lb 6.4 oz (63.2 kg)  01/15/18 139 lb 9.6 oz (63.3 kg)    Lab Results  Component Value Date   FREET4 0.95 01/12/2018   FREET4 0.68 09/15/2017   FREET4 0.58 (L) 08/03/2017   TSH 0.13 (L) 07/09/2018   TSH 0.85 01/12/2018   TSH 0.51 09/15/2017   Thyroid ultrasound as of 04/2017 showed a multinodular goiter with mostly mixed cystic/solid nodules up to 2 cm   Allergies as of 08/31/2018   No Known Allergies     Medication List    as of 08/31/2018  9:18 AM   You have not been prescribed any medications.     Allergies: No Known Allergies  Past Medical History:  Diagnosis Date  . Medical history non-contributory   . Thyroid disease     Past Surgical History:  Procedure Laterality Date  . NO PAST SURGERIES      Family History  Problem Relation Age of Onset  . Thyroid disease Neg Hx   . Cancer Neg Hx   . Diabetes Neg Hx     Social History:  reports that she has never smoked. She has never used smokeless tobacco. She reports that she does not drink alcohol or  use drugs.    Review of Systems    Examination:   BP 116/80   Pulse 79   Ht 5\' 1"  (1.549 m)   Wt 141 lb (64 kg)   SpO2 99%   BMI 26.64 kg/m           Right thyroid lobe is enlarged about 2-2.5 times normal, soft to firm, smooth and no nodules felt  Left lobe enlarged about 2 times normal, smooth and soft  Biceps reflexes are normal Skin temperature normal No tremor   Assessment/Plan:  Multinodular, long-standing with negative TPO antibodies  In 8/19 she appeared to have subclinical hyperthyroidism although TSH not completely suppressed Also her thyroid appeared to be larger in size without tenderness Clinically she is doing  well now Her thyroid size appears to be smaller on the right and relatively soft  Again she may have had silent thyroiditis with abnormal TSH last month and will recheck her labs to decide on further management and follow-up   Reather Littler 08/31/2018

## 2018-08-31 NOTE — Progress Notes (Signed)
Please call to let patient know that the lab results are normal and no further action needed

## 2018-09-01 LAB — THYROTROPIN RECEPTOR AUTOABS: Thyrotropin Receptor Ab: <1.1 IU/L (ref 0.00–1.75)

## 2019-08-28 ENCOUNTER — Other Ambulatory Visit: Payer: Self-pay | Admitting: Endocrinology

## 2019-08-28 DIAGNOSIS — E042 Nontoxic multinodular goiter: Secondary | ICD-10-CM

## 2019-08-29 ENCOUNTER — Other Ambulatory Visit (INDEPENDENT_AMBULATORY_CARE_PROVIDER_SITE_OTHER): Payer: BC Managed Care – PPO

## 2019-08-29 ENCOUNTER — Other Ambulatory Visit: Payer: Self-pay

## 2019-08-29 DIAGNOSIS — E042 Nontoxic multinodular goiter: Secondary | ICD-10-CM

## 2019-08-29 LAB — T4, FREE: Free T4: 0.85 ng/dL (ref 0.60–1.60)

## 2019-08-29 LAB — TSH: TSH: 0.79 u[IU]/mL (ref 0.35–4.50)

## 2019-08-29 LAB — T3, FREE: T3, Free: 3.3 pg/mL (ref 2.3–4.2)

## 2019-09-01 ENCOUNTER — Ambulatory Visit (INDEPENDENT_AMBULATORY_CARE_PROVIDER_SITE_OTHER): Payer: BC Managed Care – PPO | Admitting: Endocrinology

## 2019-09-01 ENCOUNTER — Encounter: Payer: Self-pay | Admitting: Endocrinology

## 2019-09-01 ENCOUNTER — Other Ambulatory Visit: Payer: Self-pay

## 2019-09-01 VITALS — BP 130/80 | HR 92 | Ht 61.0 in | Wt 144.2 lb

## 2019-09-01 DIAGNOSIS — Z23 Encounter for immunization: Secondary | ICD-10-CM | POA: Diagnosis not present

## 2019-09-01 DIAGNOSIS — E042 Nontoxic multinodular goiter: Secondary | ICD-10-CM

## 2019-09-01 NOTE — Progress Notes (Signed)
Patient ID: Desiree Watson, female   DOB: Jul 11, 1991, 28 y.o.   MRN: [id number]              Reason for Appointment: Follow-up of thyroid    History of Present Illness:    History obtained on initial consultation in June: The patient's thyroid enlargement was first discovered in 2011 approximately, at that time she was having a sensation of something in her throat when swallowing She was apparently in [place] at that time and was seen by a local physician She does not know what diagnosis was made.  However she was given an unknown medication which she took for 3 years With this her local symptoms resolved and she was told to stop the medication after taking it for 3 years She has had no difficulty with swallowing, however she has a feeling of something stuck in her throat area when swallowing although it is not uncomfortable  After initial consultation in 6/18 she had findings of euthyroid goiter with insignificant nodules on initial evaluation and no evidence of Hashimoto's thyroiditis She was seen in early pregnancy in 8/18 for follow-up of her thyroid when she was complaining of a little more fatigue and general weakness she was having vomiting but she did not have hyperemesis She did not have any typical symptoms of hyperthyroidism with shakiness, palpitations, heat intolerance or significant weight loss Because of her significant hyperthyroidism and free T4 of 3.6 she was started on PTU 50 mg 3 times a day Thyrotropin antibody was negative  Since her thyrotropin receptor antibody was negative she was told to reduce her PTU dosage and eventually stopped in September  She had a history of silent thyroiditis 2018 during her pregnancy She had delivered twin babies in 11/2017  RECENT history:  MULTINODULAR goiter:  On a routine follow-up visit her TSH in 8/19 was low but not undetectable without any symptoms Subsequently it has been normal  She did not complain of any fatigue or lethargy  recently No hair loss No significant weight change  She does not have any local discomfort or pressure in her neck.  No difficulty swallowing  Thyroid functions have been consistently normal   Wt Readings from Last 3 Encounters:  09/01/19 144 lb 3.2 oz (65.4 kg)  08/31/18 141 lb (64 kg)  07/12/18 139 lb 6.4 oz (63.2 kg)    Lab Results  Component Value Date   FREET4 0.85 08/29/2019   FREET4 1.01 08/31/2018   FREET4 0.95 01/12/2018   TSH 0.79 08/29/2019   TSH 1.64 08/31/2018   TSH 0.13 (L) 07/09/2018   Thyroid ultrasound as of 04/2017 showed a multinodular goiter with mostly mixed cystic/solid nodules up to 2 cm    Allergies as of 09/01/2019   No Known Allergies     Medication List    as of September 01, 2019  4:56 PM   You have not been prescribed any medications.     Allergies: No Known Allergies  Past Medical History:  Diagnosis Date  . Medical history non-contributory   . Thyroid disease     Past Surgical History:  Procedure Laterality Date  . NO PAST SURGERIES      Family History  Problem Relation Age of Onset  . Thyroid disease Neg Hx   . Cancer Neg Hx   . Diabetes Neg Hx     Social History:  reports that she has never smoked. She has never used smokeless tobacco. She reports that she does not drink alcohol  or use drugs.    Review of Systems    Examination:   BP 130/80 (BP Location: Left Arm, Patient Position: Sitting, Cuff Size: Normal)   Pulse 92   Ht 5\' 1"  (1.549 m)   Wt 144 lb 3.2 oz (65.4 kg)   SpO2 99%   BMI 27.25 kg/m           Right thyroid lobe is enlarged about 2-2.5 times normal, soft to firm, indistinct nodules felt  Left lobe enlarged about 2 times normal, smooth and relatively soft  Biceps reflexes are normal    Assessment/Plan:  Multinodular, long-standing with negative TPO antibodies  Her thyroid enlargement is about the same as a year ago She has no local pressure symptoms or difficulty swallowing  Thyroid  functions have been more consistently normal now even though she had a low TSH without hyperthyroidism in 06/2018 and previous history of silent thyroiditis  She will continue to follow-up annually now Influenza vaccine given   07/2018 09/01/2019

## 2020-09-03 ENCOUNTER — Other Ambulatory Visit: Payer: Self-pay | Admitting: Endocrinology

## 2020-09-03 DIAGNOSIS — E042 Nontoxic multinodular goiter: Secondary | ICD-10-CM

## 2020-09-07 ENCOUNTER — Other Ambulatory Visit: Payer: Self-pay

## 2020-09-07 ENCOUNTER — Other Ambulatory Visit (INDEPENDENT_AMBULATORY_CARE_PROVIDER_SITE_OTHER): Payer: BC Managed Care – PPO

## 2020-09-07 DIAGNOSIS — E042 Nontoxic multinodular goiter: Secondary | ICD-10-CM

## 2020-09-07 LAB — TSH: TSH: 0.61 u[IU]/mL (ref 0.35–4.50)

## 2020-09-07 LAB — T4, FREE: Free T4: 0.82 ng/dL (ref 0.60–1.60)

## 2020-09-07 LAB — T3, FREE: T3, Free: 3.5 pg/mL (ref 2.3–4.2)

## 2020-09-13 ENCOUNTER — Encounter: Payer: Self-pay | Admitting: Endocrinology

## 2020-09-13 ENCOUNTER — Other Ambulatory Visit: Payer: Self-pay

## 2020-09-13 ENCOUNTER — Ambulatory Visit (INDEPENDENT_AMBULATORY_CARE_PROVIDER_SITE_OTHER): Payer: BC Managed Care – PPO | Admitting: Endocrinology

## 2020-09-13 VITALS — BP 124/88 | HR 86 | Ht 61.0 in | Wt 143.6 lb

## 2020-09-13 DIAGNOSIS — Z23 Encounter for immunization: Secondary | ICD-10-CM | POA: Diagnosis not present

## 2020-09-13 DIAGNOSIS — E042 Nontoxic multinodular goiter: Secondary | ICD-10-CM | POA: Diagnosis not present

## 2020-09-13 NOTE — Progress Notes (Signed)
Patient ID: Desiree Watson, female   DOB: 05-22-1991, 29 y.o.   MRN: [id number]              Reason for Appointment: Follow-up of thyroid    History of Present Illness:    History obtained on initial consultation in June: The patient's thyroid enlargement was first discovered in 2011 approximately, at that time she was having a sensation of something in her throat when swallowing She was apparently in [place] at that time and was seen by a local physician She does not know what diagnosis was made.  However she was given an unknown medication which she took for 3 years With this her local symptoms resolved and she was told to stop the medication after taking it for 3 years She has had no difficulty with swallowing, however she has a feeling of something stuck in her throat area when swallowing although it is not uncomfortable  After initial consultation in 6/18 she had findings of euthyroid goiter with insignificant nodules on initial evaluation and no evidence of Hashimoto's thyroiditis She was seen in early pregnancy in 8/18 for follow-up of her thyroid when she was complaining of a little more fatigue and general weakness she was having vomiting but she did not have hyperemesis She did not have any typical symptoms of hyperthyroidism with shakiness, palpitations, heat intolerance or significant weight loss Because of her significant hyperthyroidism and free T4 of 3.6 she was started on PTU 50 mg 3 times a day Thyrotropin antibody was negative  Since her thyrotropin receptor antibody was negative she was told to reduce her PTU dosage and eventually stopped in September  She had a history of silent thyroiditis 2018 during her pregnancy She had delivered twin babies in 11/2017  RECENT history:  MULTINODULAR goiter:  On a routine follow-up visit her TSH in 8/19 was low but not undetectable without any symptoms Subsequently it has been normal  He feels fairly good with her energy level  lately, weight is about the same  She she will sometimes feel a little pressure in her neck in the lower part and she thinks her thyroid may be bigger.  No difficulty swallowing  Thyroid functions have been consistently normal   Wt Readings from Last 3 Encounters:  09/13/20 143 lb 9.6 oz (65.1 kg)  09/01/19 144 lb 3.2 oz (65.4 kg)  08/31/18 141 lb (64 kg)    Lab Results  Component Value Date   FREET4 0.82 09/07/2020   FREET4 0.85 08/29/2019   FREET4 1.01 08/31/2018   TSH 0.61 09/07/2020   TSH 0.79 08/29/2019   TSH 1.64 08/31/2018   Thyroid ultrasound as of 04/2017 showed a multinodular goiter with mostly mixed cystic/solid nodules up to 2 cm    Allergies as of 09/13/2020   No Known Allergies     Medication List    as of September 13, 2020  9:06 AM   You have not been prescribed any medications.     Allergies: No Known Allergies  Past Medical History:  Diagnosis Date  . Medical history non-contributory   . Thyroid disease     Past Surgical History:  Procedure Laterality Date  . NO PAST SURGERIES      Family History  Problem Relation Age of Onset  . Thyroid disease Neg Hx   . Cancer Neg Hx   . Diabetes Neg Hx     Social History:  reports that she has never smoked. She has never used smokeless tobacco. She  reports that she does not drink alcohol and does not use drugs.    Review of Systems    Examination:   BP 124/88   Pulse 86   Ht 5\' 1"  (1.549 m)   Wt 143 lb 9.6 oz (65.1 kg)   SpO2 99%   BMI 27.13 kg/m           Right thyroid lobe is enlarged about 2-2.5 times normal, soft to firm, no distinct nodules felt  Left lobe enlarged about 2 times normal, smooth and softer without any nodules  Biceps reflexes are normal  Neck circumference over the thyroid is 36 cm    Assessment/Plan:  Multinodular, long-standing with negative TPO antibodies  Her thyroid enlargement clinically is about the same as a year ago Reassured her that her thyroid  is not more prominent except possibly in the isthmus No need for further ultrasound exam and most of her nodules were small and cystic  Thyroid function tests are normal again  She will continue to follow-up annually  Advised her that she needs to get the Covid vaccine but she says this is going to be decided by her husband.  Given her patient information to take home  Influenza vaccine given   09/13/2020

## 2021-09-16 ENCOUNTER — Other Ambulatory Visit: Payer: Self-pay | Admitting: Endocrinology

## 2021-09-16 ENCOUNTER — Other Ambulatory Visit (INDEPENDENT_AMBULATORY_CARE_PROVIDER_SITE_OTHER): Payer: BC Managed Care – PPO

## 2021-09-16 ENCOUNTER — Other Ambulatory Visit: Payer: Self-pay

## 2021-09-16 DIAGNOSIS — E042 Nontoxic multinodular goiter: Secondary | ICD-10-CM

## 2021-09-16 LAB — TSH: TSH: 0.37 u[IU]/mL (ref 0.35–5.50)

## 2021-09-16 LAB — T4, FREE: Free T4: 0.79 ng/dL (ref 0.60–1.60)

## 2021-09-19 ENCOUNTER — Other Ambulatory Visit: Payer: Self-pay

## 2021-09-19 ENCOUNTER — Encounter: Payer: Self-pay | Admitting: Endocrinology

## 2021-09-19 ENCOUNTER — Ambulatory Visit (INDEPENDENT_AMBULATORY_CARE_PROVIDER_SITE_OTHER): Payer: BC Managed Care – PPO | Admitting: Endocrinology

## 2021-09-19 VITALS — BP 128/88 | HR 85 | Ht 61.0 in | Wt 145.4 lb

## 2021-09-19 DIAGNOSIS — E040 Nontoxic diffuse goiter: Secondary | ICD-10-CM

## 2021-09-19 DIAGNOSIS — Z23 Encounter for immunization: Secondary | ICD-10-CM | POA: Diagnosis not present

## 2021-09-19 NOTE — Progress Notes (Signed)
Patient ID: Desiree Watson, female   DOB: 1991/01/03, 30 y.o.   MRN: [id number]              Reason for Appointment: Follow-up of thyroid    History of Present Illness:    History obtained on initial consultation in June: The patient's thyroid enlargement was first discovered in 2011 approximately, at that time she was having a sensation of something in her throat when swallowing She was apparently in [place] at that time and was seen by a local physician She does not know what diagnosis was made.  However she was given an unknown medication which she took for 3 years With this her local symptoms resolved and she was told to stop the medication after taking it for 3 years She has had no difficulty with swallowing, however she has a feeling of something stuck in her throat area when swallowing although it is not uncomfortable  After initial consultation in 6/18 she had findings of euthyroid goiter with insignificant nodules on initial evaluation and no evidence of Hashimoto's thyroiditis She was seen in early pregnancy in 8/18 for follow-up of her thyroid when she was complaining of a little more fatigue and general weakness she was having vomiting but she did not have hyperemesis She did not have any typical symptoms of hyperthyroidism with shakiness, palpitations, heat intolerance or significant weight loss Because of her significant hyperthyroidism and free T4 of 3.6 she was started on PTU 50 mg 3 times a day Thyrotropin antibody was negative  Since her thyrotropin receptor antibody was negative she was told to reduce her PTU dosage and eventually stopped in September  She had a history of silent thyroiditis 2018 during her pregnancy She had delivered twin babies in 11/2017  RECENT history:  MULTINODULAR goiter:  On a routine follow-up visit her TSH in 8/19 was low but not undetectable without any symptoms Subsequently it has been consistently normal She is here for her annual  visit  Overall she feels fairly good Her only symptoms again are a feeling of pressure in her neck in the middle area.  No difficulty swallowing or choking  Thyroid functions are again normal   Wt Readings from Last 3 Encounters:  09/19/21 145 lb 6.4 oz (66 kg)  09/13/20 143 lb 9.6 oz (65.1 kg)  09/01/19 144 lb 3.2 oz (65.4 kg)    Lab Results  Component Value Date   FREET4 0.79 09/16/2021   FREET4 0.82 09/07/2020   FREET4 0.85 08/29/2019   TSH 0.37 09/16/2021   TSH 0.61 09/07/2020   TSH 0.79 08/29/2019   Thyroid ultrasound as of 04/2017 showed a multinodular goiter with mostly mixed cystic/solid nodules up to 2 cm    Allergies as of 09/19/2021   No Known Allergies      Medication List        Accurate as of September 19, 2021  9:04 AM. If you have any questions, ask your nurse or doctor.          FISH OIL PO Take by mouth.        Allergies: No Known Allergies  Past Medical History:  Diagnosis Date   Medical history non-contributory    Thyroid disease     Past Surgical History:  Procedure Laterality Date   NO PAST SURGERIES      Family History  Problem Relation Age of Onset   Thyroid disease Neg Hx    Cancer Neg Hx    Diabetes Neg Hx  Social History:  reports that she has never smoked. She has never used smokeless tobacco. She reports that she does not drink alcohol and does not use drugs.    Review of Systems  Is not planning any pregnancies in the future   Examination:   BP 128/88   Pulse 85   Ht 5\' 1"  (1.549 m)   Wt 145 lb 6.4 oz (66 kg)   SpO2 98%   BMI 27.47 kg/m           Right thyroid lobe is enlarged about 2-1/2-3 times normal, she had slightly firm, no nodules felt  Left lobe enlarged about 2 times normal, smooth and soft, smooth Trachea midline  Stridor not present    Assessment/Plan:  Multinodular, long-standing with negative TPO antibodies Prior ultrasound showed rather small and cystic nodules only  Her  thyroid enlargement clinically is unchanged from previous exams Her right lobe is generally larger No local pressure symptoms such as difficulty swallowing or choking  Explained to her that observation only as needed unless she has excessive pressure symptoms or difficulty swallowing  Thyroid function tests are normal consistently  She will continue to follow-up annually  Influenza vaccine given   09/19/2021

## 2022-08-18 DIAGNOSIS — Z0000 Encounter for general adult medical examination without abnormal findings: Secondary | ICD-10-CM | POA: Diagnosis not present

## 2022-08-21 DIAGNOSIS — E782 Mixed hyperlipidemia: Secondary | ICD-10-CM | POA: Diagnosis not present

## 2022-08-21 DIAGNOSIS — Z0000 Encounter for general adult medical examination without abnormal findings: Secondary | ICD-10-CM | POA: Diagnosis not present

## 2022-09-22 ENCOUNTER — Other Ambulatory Visit (INDEPENDENT_AMBULATORY_CARE_PROVIDER_SITE_OTHER): Payer: BC Managed Care – PPO

## 2022-09-22 ENCOUNTER — Other Ambulatory Visit: Payer: Self-pay | Admitting: Endocrinology

## 2022-09-22 DIAGNOSIS — E040 Nontoxic diffuse goiter: Secondary | ICD-10-CM

## 2022-09-22 LAB — T4, FREE: Free T4: 0.68 ng/dL (ref 0.60–1.60)

## 2022-09-22 LAB — TSH: TSH: 0.58 u[IU]/mL (ref 0.35–5.50)

## 2022-09-25 ENCOUNTER — Encounter: Payer: Self-pay | Admitting: Endocrinology

## 2022-09-25 ENCOUNTER — Ambulatory Visit (INDEPENDENT_AMBULATORY_CARE_PROVIDER_SITE_OTHER): Payer: BC Managed Care – PPO | Admitting: Endocrinology

## 2022-09-25 VITALS — BP 144/84 | HR 75 | Ht 61.0 in | Wt 145.6 lb

## 2022-09-25 DIAGNOSIS — E042 Nontoxic multinodular goiter: Secondary | ICD-10-CM | POA: Diagnosis not present

## 2022-09-25 DIAGNOSIS — Z23 Encounter for immunization: Secondary | ICD-10-CM | POA: Diagnosis not present

## 2022-09-25 NOTE — Progress Notes (Signed)
Patient ID: Desiree Watson, female   DOB: 09/06/1991, 31 y.o.   MRN: [id number]              Reason for Appointment: Follow-up of thyroid    History of Present Illness:    History obtained on initial consultation in June: The patient's thyroid enlargement was first discovered in 2011 approximately, at that time she was having a sensation of something in her throat when swallowing She was apparently in [place] at that time and was seen by a local physician She does not know what diagnosis was made.  However she was given an unknown medication which she took for 3 years With this her local symptoms resolved and she was told to stop the medication after taking it for 3 years She has had no difficulty with swallowing, however she has a feeling of something stuck in her throat area when swallowing although it is not uncomfortable  After initial consultation in 6/18 she had findings of euthyroid goiter with insignificant nodules on initial evaluation and no evidence of Hashimoto's thyroiditis She was seen in early pregnancy in 8/18 for follow-up of her thyroid when she was complaining of a little more fatigue and general weakness she was having vomiting but she did not have hyperemesis She did not have any typical symptoms of hyperthyroidism with shakiness, palpitations, heat intolerance or significant weight loss Because of her significant hyperthyroidism and free T4 of 3.6 she was started on PTU 50 mg 3 times a day Thyrotropin antibody was negative  Since her thyrotropin receptor antibody was negative she was told to reduce her PTU dosage and eventually stopped in September  She had a history of silent thyroiditis 2018 during her pregnancy She had delivered twin babies in 11/2017  RECENT history:  MULTINODULAR goiter:  On a routine follow-up visit her TSH in 8/19 was low but not undetectable without any symptoms Subsequently it has been consistently normal She is here for her annual  visit  She thinks her thyroid swelling is a little more Currently not complaining of difficulty with breathing or tightness in her neck, no difficulty swallowing  Thyroid functions are again normal   Wt Readings from Last 3 Encounters:  09/25/22 145 lb 9.6 oz (66 kg)  09/19/21 145 lb 6.4 oz (66 kg)  09/13/20 143 lb 9.6 oz (65.1 kg)    Lab Results  Component Value Date   FREET4 0.68 09/22/2022   FREET4 0.79 09/16/2021   FREET4 0.82 09/07/2020   TSH 0.58 09/22/2022   TSH 0.37 09/16/2021   TSH 0.61 09/07/2020   Thyroid ultrasound as of 04/2017 showed a multinodular goiter with mostly mixed cystic/solid nodules up to 2 cm    Allergies as of 09/25/2022   No Known Allergies      Medication List        Accurate as of September 25, 2022  9:33 AM. If you have any questions, ask your nurse or doctor.          FISH OIL PO Take by mouth.        Allergies: No Known Allergies  Past Medical History:  Diagnosis Date   Medical history non-contributory    Thyroid disease     Past Surgical History:  Procedure Laterality Date   NO PAST SURGERIES      Family History  Problem Relation Age of Onset   Thyroid disease Neg Hx    Cancer Neg Hx    Diabetes Neg Hx  Social History:  reports that she has never smoked. She has never used smokeless tobacco. She reports that she does not drink alcohol and does not use drugs.    Review of Systems  Is not planning any pregnancies in the future   Examination:   BP (!) 144/84 (Patient Position: Standing)   Pulse 75   Ht 5\' 1"  (1.549 m)   Wt 145 lb 9.6 oz (66 kg)   SpO2 94%   BMI 27.51 kg/m      On inspection her thyroid appears to be more prominent in the midline       Right thyroid lobe is enlarged about 2-1/2 times normal, smooth with firm texture Isthmus is significantly prominent also but not nodular  Left lobe enlarged about 2 times normal, smooth and relatively softer No lymphadenopathy  Neck  circumference 36 cm  Stridor not present    Assessment/Plan:  Multinodular, long-standing with negative TPO antibodies Prior ultrasound showed relatively small and cystic nodules and TI-RADS score 2 or less  Her thyroid enlargement clinically is not any larger although appears to be more prominent in the isthmus area  She is somewhat concerned about the prominence of her goiter but she does not have any local pressure symptoms  Explained to her that surgery will be needed if she has excessive pressure symptoms or difficulty swallowing  Thyroid function tests are normal consistently  She will continue to follow-up annually  Influenza vaccine given   09/25/2022

## 2023-09-21 ENCOUNTER — Other Ambulatory Visit: Payer: Self-pay

## 2023-09-21 DIAGNOSIS — E042 Nontoxic multinodular goiter: Secondary | ICD-10-CM

## 2023-09-21 DIAGNOSIS — E040 Nontoxic diffuse goiter: Secondary | ICD-10-CM

## 2023-09-28 ENCOUNTER — Other Ambulatory Visit (INDEPENDENT_AMBULATORY_CARE_PROVIDER_SITE_OTHER): Payer: BC Managed Care – PPO

## 2023-09-28 DIAGNOSIS — E040 Nontoxic diffuse goiter: Secondary | ICD-10-CM | POA: Diagnosis not present

## 2023-09-28 DIAGNOSIS — E042 Nontoxic multinodular goiter: Secondary | ICD-10-CM

## 2023-09-28 LAB — T4, FREE: Free T4: 0.87 ng/dL (ref 0.60–1.60)

## 2023-09-28 LAB — TSH: TSH: 0.42 u[IU]/mL (ref 0.35–5.50)

## 2023-10-01 ENCOUNTER — Encounter: Payer: Self-pay | Admitting: Endocrinology

## 2023-10-01 ENCOUNTER — Ambulatory Visit: Payer: BC Managed Care – PPO | Admitting: Endocrinology

## 2023-10-01 ENCOUNTER — Ambulatory Visit (INDEPENDENT_AMBULATORY_CARE_PROVIDER_SITE_OTHER): Payer: BC Managed Care – PPO | Admitting: Endocrinology

## 2023-10-01 VITALS — BP 122/80 | HR 97 | Resp 20 | Ht 61.0 in | Wt 143.6 lb

## 2023-10-01 DIAGNOSIS — Z23 Encounter for immunization: Secondary | ICD-10-CM

## 2023-10-01 DIAGNOSIS — E042 Nontoxic multinodular goiter: Secondary | ICD-10-CM

## 2023-10-01 DIAGNOSIS — E010 Iodine-deficiency related diffuse (endemic) goiter: Secondary | ICD-10-CM

## 2023-10-01 NOTE — Progress Notes (Signed)
Outpatient Endocrinology Note [place] Ashanti Ratti, MD  10/01/23  Patient's Name: Desiree Watson    DOB: May 14, 1991    MRN: [id number]  REASON OF VISIT:  Follow-up for thyroid nodules / multinodular goiter  PCP: Sebastian Ache, PA-C  HISTORY OF PRESENT ILLNESS:   Milisa Kimbell is a 32 y.o. old female with past medical history as listed below is presented for a follow up of thyroid nodules / multinodular goiter.  Patient was last seen by Dr. Lucianne Muss in October 2023.  Pertinent Thyroid History:  Patient was found to have enlarged thyroid in 2011, during the initial years of diagnosis she was in [place] and had taken medication for 3 years, she does not know the name of the medication ?  Levothyroxine.  Medication was later stopped.  She has no dysphagia however she feels something stuck in the throat.  She was initially seen in endocrinology clinic in June 2018.  -Ultrasound thyroid in May 06, 2017 : Reviewed images, multinodular goiter with mostly mixed cystic/solid nodules /heterogeneous, mostly subcentimeter and up to 2 cm.   -No family history of thyroid disorder.  No family history of thyroid cancer.  No radiation exposure to head and neck.  -In 2018 she had hypothyroidism with elevated free T4, was treated with propylthiouracil 50 mg 3 times a day.  She had negative thyrotropin receptor antibody and thyroid peroxidase antibody in the past.  It seems like she had silent thyroiditis during pregnancy in 2018.  PTU was gradually tapered and stopped after few months.  In 2019 she had delivered twin babies.  Patient has persistently normal thyroid function test from 2019 for several years.  She is euthyroid.  Has not been on thyroid medication.  Interval history 10/01/23 Patient presented for follow-up of multinodular goiter.  She is accompanied by her husband in the clinic today.  She has occasional neck discomfort, something is stuck in the throat however symptoms has not been worsening.  She  denies dysphagia.  She denies difficulty breathing in any position.  Overall she is feeling good.  No hypo and hyperthyroid symptoms.  No palpitation.  She had normal thyroid function test few days ago.  No plan for pregnancy in the future.   Latest Reference Range & Units 09/28/23 09:07  TSH 0.35 - 5.50 uIU/mL 0.42  T4,Free(Direct) 0.60 - 1.60 ng/dL 6.30   She wants to have flu vaccine today.  REVIEW OF SYSTEMS:  As per history of present illness.   PAST MEDICAL HISTORY: Past Medical History:  Diagnosis Date   Medical history non-contributory    Thyroid disease     PAST SURGICAL HISTORY: Past Surgical History:  Procedure Laterality Date   NO PAST SURGERIES      ALLERGIES: No Known Allergies  FAMILY HISTORY:  Family History  Problem Relation Age of Onset   Thyroid disease Neg Hx    Cancer Neg Hx    Diabetes Neg Hx     SOCIAL HISTORY: Social History   Socioeconomic History   Marital status: Married    Spouse name: Not on file   Number of children: Not on file   Years of education: Not on file   Highest education level: Not on file  Occupational History   Not on file  Tobacco Use   Smoking status: Never   Smokeless tobacco: Never  Substance and Sexual Activity   Alcohol use: No   Drug use: No   Sexual activity: Not Currently    Birth control/protection: None  Other Topics Concern   Not on file  Social History Narrative   Not on file   Social Determinants of Health   Financial Resource Strain: Not on file  Food Insecurity: Not on file  Transportation Needs: Not on file  Physical Activity: Not on file  Stress: Not on file  Social Connections: Not on file    MEDICATIONS:  Current Outpatient Medications  Medication Sig Dispense Refill   lisinopril (ZESTRIL) 10 MG tablet Take 10 mg by mouth daily.     Omega-3 Fatty Acids (FISH OIL PO) Take by mouth.     No current facility-administered medications for this visit.    PHYSICAL EXAM: Vitals:    10/01/23 0908  BP: 122/80  Pulse: 97  Resp: 20  SpO2: 98%  Weight: 143 lb 9.6 oz (65.1 kg)  Height: 5\' 1"  (1.549 m)   Body mass index is 27.13 kg/m.  Wt Readings from Last 3 Encounters:  10/01/23 143 lb 9.6 oz (65.1 kg)  09/25/22 145 lb 9.6 oz (66 kg)  09/19/21 145 lb 6.4 oz (66 kg)     General: Well developed, well nourished female in no apparent distress.  HEENT: AT/[place], no external lesions. Hearing intact to the spoken word Eyes: EOMI. No stare, proptosis. Conjunctiva clear and no icterus. Neck: Trachea midline, neck supple with appreciable thyromegaly x 2 or no lymphadenopathy and b/l  palpable thyroid nodules Lungs: Clear to auscultation, no wheeze. Respirations not labored Heart: S1S2, Regular in rate and rhythm.  Abdomen: Soft, non tender, non distended Neurologic: Alert, oriented, normal speech, deep tendon biceps reflexes normal,  no gross focal neurological deficit Extremities: No pedal pitting edema, no tremors of outstretched hands Skin: Warm, color good.  Psychiatric: Does not appear depressed or anxious  PERTINENT HISTORIC LABORATORY AND IMAGING STUDIES:  All pertinent laboratory results were reviewed. Please see HPI also for further details.   TSH  Date Value Ref Range Status  09/28/2023 0.42 0.35 - 5.50 uIU/mL Final  09/22/2022 0.58 0.35 - 5.50 uIU/mL Final  09/16/2021 0.37 0.35 - 5.50 uIU/mL Final     Latest Reference Range & Units 04/27/17 08:58 06/25/17 11:06 08/31/18 09:40  Thyroperoxidase Ab SerPl-aCnc 0 - 34 IU/mL 18    Thyrotropin Receptor Ab 0.00 - 1.75 IU/L  0.65 <1.10    EXAM: THYROID ULTRASOUND May 06, 2017.   TECHNIQUE: Ultrasound examination of the thyroid gland and adjacent soft tissues was performed.   COMPARISON:  None.   FINDINGS: Parenchymal Echotexture: Moderately heterogenous   Isthmus: 0.6 cm   Right lobe: 6.0 cm x 2.2 cm x 2.6 cm   Left lobe: 5.6 cm x 2.3 cm x 2.3 cm    _________________________________________________________   Estimated total number of nodules >/= 1 cm: 4   Number of spongiform nodules >/=  2 cm not described below (TR1): 0   Number of mixed cystic and solid nodules >/= 1.5 cm not described below (TR2): 0   _________________________________________________________   Nodule # 1:   Location: Isthmus; Mid   Maximum size: 1.2 cm; Other 2 dimensions: 1.0 cm x 0.9 cm   Composition: mixed cystic and solid (1)   Echogenicity: isoechoic (1)   Shape: not taller-than-wide (0)   Margins: smooth (0)   Echogenic foci: none (0)   ACR TI-RADS total points: 2.   ACR TI-RADS risk category: TR2 (2 points).   ACR TI-RADS recommendations:   Nodule does not meet criteria for surveillance or biopsy   _________________________________________________________   Nodule #  2:   Location: Right; Superior   Maximum size: 0.8 cm; Other 2 dimensions: 0.7 cm x 0.4 cm   Composition: cannot determine (2)   Echogenicity: isoechoic (1)   Shape: not taller-than-wide (0)   Margins: ill-defined (0)   Echogenic foci: none (0)   ACR TI-RADS total points: 3.   ACR TI-RADS risk category: TR3 (3 points).   ACR TI-RADS recommendations:   Nodule does not meet criteria for surveillance or biopsy   _________________________________________________________   Nodule # 3:   Location: Right; Mid   Maximum size: 2.0 cm; Other 2 dimensions: 1.2 cm x 0.9 cm   Composition: mixed cystic and solid (1)   Echogenicity: isoechoic (1)   Shape: not taller-than-wide (0)   Margins: ill-defined (0)   Echogenic foci: none (0)   ACR TI-RADS total points: 2.   ACR TI-RADS risk category: TR2 (2 points).   ACR TI-RADS recommendations:   Nodule does not meet criteria for surveillance or biopsy   _________________________________________________________   Nodule # 4:   Location: Right; Inferior   Maximum size: 1.1 cm; Other 2 dimensions: 1.0  cm x 0.8 cm   Composition: solid/almost completely solid (2)   Echogenicity: isoechoic (1)   Shape: not taller-than-wide (0)   Margins: ill-defined (0)   Echogenic foci: none (0)   ACR TI-RADS total points: 3.   ACR TI-RADS risk category: TR3 (3 points).   ACR TI-RADS recommendations:   Nodule does not meet criteria for surveillance or biopsy   _________________________________________________________   Nodule # 5:   Location: Right; Inferior   Maximum size: 0.7 cm; Other 2 dimensions: 0.6 cm x 0.6 cm   Composition: mixed cystic and solid (1)   Echogenicity: isoechoic (1)   Shape: not taller-than-wide (0)   Margins: ill-defined (0)   Echogenic foci: none (0)   ACR TI-RADS total points: 2.   ACR TI-RADS risk category: TR2 (2 points).   ACR TI-RADS recommendations:   Nodule does not meet criteria for surveillance or biopsy   _________________________________________________________   Nodule # 6:   Location: Left; Inferior   Maximum size: 1.6 cm; Other 2 dimensions: 1.2 cm x 1.4 cm   Composition: mixed cystic and solid (1)   Echogenicity: isoechoic (1)   Shape: not taller-than-wide (0)   Margins: ill-defined (0)   Echogenic foci: none (0)   ACR TI-RADS total points: 2.   ACR TI-RADS risk category: TR2 (2 points).   ACR TI-RADS recommendations:   Nodule does not meet criteria for surveillance or biopsy   _________________________________________________________   No adenopathy   IMPRESSION: No thyroid nodule meets criteria for biopsy or surveillance, as designated by the newly established ACR TI-RADS criteria.   ASSESSMENT / PLAN  1. Multinodular goiter   2. Encounter for immunization   3. Thyromegaly     - No compressive symptoms except some neck discomfort and feels like something is stuck in the throat, euthyroid, no family h/o thyroid cancer, no h/o radiation exposure.  She has remained euthyroid for several years without thyroid  medication.  -History of silent thyroiditis in pregnancy in 2018 however had taken propylthiouracil was stopped after few months.  -Patient is known to have enlarged thyroid at least from 2011.  -Ultrasound thyroid in May 06, 2017 : Reviewed images, multinodular goiter with mostly mixed cystic/solid nodules /heterogeneous, mostly subcentimeter and up to 2 cm.  No worrisome features on thyroid nodules.  No indication of biopsy based on ultrasound in 2018.  -These  nodules can be monitored with serial ultrasound.  -No significant neck compressive symptoms.  No indication of thyroid surgery at this time.  Plan: -I would like to check thyroid ultrasound to monitor thyroid nodules.  Last ultrasound in 2018. -Annual thyroid function test. -Annual endocrinology follow-up.  Diagnoses and all orders for this visit:  Multinodular goiter -     US THYROID; Future -     T4, free; Future -     TSH; Future  Encounter for immunization -     Flu vaccine trivalent PF, 6mos and older(Flulaval,Afluria,Fluarix,Fluzone)  Thyromegaly    DISPOSITION Follow up in clinic in 12 months suggested.  All questions answered and patient verbalized understanding of the plan.  [place] Staci [place], MD [facility] Endocrinology [facility] [address] [place], [address] [place], [place] [address] Phone # [phone]  At least part of this note was generated using voice recognition software. Inadvertent word errors may have occurred, which were not recognized during the proofreading process.

## 2023-10-01 NOTE — Addendum Note (Signed)
Addended by: Solara Goodchild, [place] on: 10/01/2023 09:39 AM   Modules accepted: Level of Service

## 2023-10-01 NOTE — Patient Instructions (Addendum)
Plan for US thyroid.

## 2023-10-12 ENCOUNTER — Ambulatory Visit
Admission: RE | Admit: 2023-10-12 | Discharge: 2023-10-12 | Disposition: A | Discharge status: Home / Self Care | Payer: BC Managed Care – PPO | Source: Ambulatory Visit | Attending: Endocrinology | Admitting: Endocrinology

## 2023-10-12 DIAGNOSIS — E042 Nontoxic multinodular goiter: Secondary | ICD-10-CM

## 2023-10-20 ENCOUNTER — Telehealth: Payer: Self-pay

## 2023-10-20 NOTE — Telephone Encounter (Signed)
Patient given results with help from interpreter as directed by MD.

## 2023-10-20 NOTE — Telephone Encounter (Signed)
-----   Message from [place] Thapa sent at 10/19/2023  9:03 PM EST ----- Please notify patient of ultrasound thyroid reviewed bilateral multiple thyroid nodules, some are subcentimeter and some are measuring up to 2 cm relatively stable compared to ultrasound in 2018.  No recent/high risk features.  Will continue to monitor with ultrasound.

## 2024-09-26 ENCOUNTER — Other Ambulatory Visit: Payer: Self-pay | Admitting: Endocrinology

## 2024-09-26 ENCOUNTER — Other Ambulatory Visit: Payer: BC Managed Care – PPO

## 2024-09-26 DIAGNOSIS — E042 Nontoxic multinodular goiter: Secondary | ICD-10-CM

## 2024-09-27 ENCOUNTER — Ambulatory Visit: Payer: Self-pay | Admitting: Endocrinology

## 2024-09-27 LAB — T4, FREE: Free T4: 1.3 ng/dL (ref 0.8–1.8)

## 2024-09-27 LAB — TSH: TSH: 0.44 m[IU]/L

## 2024-09-30 ENCOUNTER — Ambulatory Visit (INDEPENDENT_AMBULATORY_CARE_PROVIDER_SITE_OTHER): Payer: BC Managed Care – PPO | Admitting: Endocrinology

## 2024-09-30 ENCOUNTER — Encounter: Payer: Self-pay | Admitting: Endocrinology

## 2024-09-30 VITALS — BP 128/82 | HR 78 | Resp 20 | Ht 60.0 in | Wt 125.2 lb

## 2024-09-30 DIAGNOSIS — E042 Nontoxic multinodular goiter: Secondary | ICD-10-CM | POA: Diagnosis not present

## 2024-09-30 NOTE — Progress Notes (Signed)
 Outpatient Endocrinology Note Desiree Midkiff, MD  09/30/24  Patient's Name: Desiree Watson    DOB: Nov 15, 1991    MRN: [id number]  REASON OF VISIT:  Follow-up for thyroid  nodules / multinodular goiter  PCP: Amalia Almarie Folks, PA-C  HISTORY OF PRESENT ILLNESS:   Desiree Watson is a 33 y.o. old female with past medical history as listed below is presented for a follow up of thyroid  nodules / multinodular goiter.    Pertinent Thyroid  History:  Patient was found to have enlarged thyroid  in 2011, during the initial years of diagnosis she was in [place] and had taken medication for 3 years, she does not know the name of the medication ?  Levothyroxine.  Medication was later stopped.  She has no dysphagia however she feels something stuck in the throat.  She was initially seen in endocrinology clinic in June 2018.  -Ultrasound thyroid  in May 06, 2017 : Reviewed images, multinodular goiter with mostly mixed cystic/solid nodules /heterogeneous, mostly subcentimeter and up to 2 cm.  - October 12, 2023 ultrasound thyroid  reviewed bilateral multiple thyroid  nodules, some are subcentimeter and some are measuring up to 2 cm relatively stable compared to ultrasound in 2018.   -No family history of thyroid  disorder.  No family history of thyroid  cancer.  No radiation exposure to head and neck.  -In 2018 she had hyperthyroidism with elevated free T4, was treated with propylthiouracil  50 mg 3 times a day.  She had negative thyrotropin receptor antibody and thyroid  peroxidase antibody in the past.  It seems like she had silent thyroiditis during pregnancy in 2018.  PTU was gradually tapered and stopped after few months.  In 2019 she had delivered twin babies.  Patient has persistently normal thyroid  function test from 2019 for several years.  She is euthyroid.  Has not been on thyroid  medication.  Interval history  Patient presented for follow-up, she complains of enlargement of the goiter/thyroid , denies  dysphagia or choking, she complains of occasional neck discomfort.  No difficulty breathing.  No palpitation or heat intolerance.  She had normal thyroid  function test this time as follows.  He has ultrasound thyroid  in November 2024 with relatively stable bilateral thyroid  nodules.  She lost about 10 to 15 pounds of weight in last 1 year.  She reports eating okay, same work.  Advised to keep an eye on weight loss if she continues to lose more weight asked to discuss with primary care provider for evaluation as well.   Latest Reference Range & Units 09/26/24 09:08  TSH mIU/L 0.44  T4,Free(Direct) 0.8 - 1.8 ng/dL 1.3    REVIEW OF SYSTEMS:  As per history of present illness.   PAST MEDICAL HISTORY: Past Medical History:  Diagnosis Date   Medical history non-contributory    Thyroid  disease     PAST SURGICAL HISTORY: Past Surgical History:  Procedure Laterality Date   NO PAST SURGERIES      ALLERGIES: No Known Allergies  FAMILY HISTORY:  Family History  Problem Relation Age of Onset   Thyroid  disease Neg Hx    Cancer Neg Hx    Diabetes Neg Hx     SOCIAL HISTORY: Social History   Socioeconomic History   Marital status: Married    Spouse name: Not on file   Number of children: Not on file   Years of education: Not on file   Highest education level: Not on file  Occupational History   Not on file  Tobacco Use   Smoking  status: Never   Smokeless tobacco: Never  Substance and Sexual Activity   Alcohol use: No   Drug use: No   Sexual activity: Not Currently    Birth control/protection: None  Other Topics Concern   Not on file  Social History Narrative   Not on file   Social Drivers of Health   Financial Resource Strain: Not on file  Food Insecurity: Not on file  Transportation Needs: Not on file  Physical Activity: Not on file  Stress: Not on file  Social Connections: Not on file    MEDICATIONS:  Current Outpatient Medications  Medication Sig Dispense  Refill   lisinopril (ZESTRIL) 10 MG tablet Take 10 mg by mouth daily.     No current facility-administered medications for this visit.    PHYSICAL EXAM: Vitals:   09/30/24 0809  BP: 128/82  Pulse: 78  Resp: 20  SpO2: 99%  Weight: 125 lb 3.2 oz (56.8 kg)  Height: 5' (1.524 m)   Body mass index is 24.45 kg/m.  Wt Readings from Last 3 Encounters:  09/30/24 125 lb 3.2 oz (56.8 kg)  10/01/23 143 lb 9.6 oz (65.1 kg)  09/25/22 145 lb 9.6 oz (66 kg)     General: Well developed, well nourished female in no apparent distress.  HEENT: AT/[place], no external lesions. Hearing intact to the spoken word Eyes: EOMI. No stare, proptosis. Conjunctiva clear and no icterus. Neck: Trachea midline, neck supple with appreciable thyromegaly x 2 or no lymphadenopathy and b/l  palpable thyroid  nodules Lungs: Clear to auscultation, no wheeze. Respirations not labored Heart: S1S2, Regular in rate and rhythm.  Abdomen: Soft, non tender, non distended Neurologic: Alert, oriented, normal speech, deep tendon biceps reflexes normal,  no gross focal neurological deficit Extremities: No pedal pitting edema, no tremors of outstretched hands Skin: Warm, color good.  Psychiatric: Does not appear depressed or anxious  PERTINENT HISTORIC LABORATORY AND IMAGING STUDIES:  All pertinent laboratory results were reviewed. Please see HPI also for further details.   TSH  Date Value Ref Range Status  09/26/2024 0.44 mIU/L Final    Comment:              Reference Range .           > or = 20 Years  0.40-4.50 .                Pregnancy Ranges           First trimester    0.26-2.66           Second trimester   0.55-2.73           Third trimester    0.43-2.91   09/28/2023 0.42 0.35 - 5.50 uIU/mL Final  09/22/2022 0.58 0.35 - 5.50 uIU/mL Final     Latest Reference Range & Units 04/27/17 08:58 06/25/17 11:06 08/31/18 09:40  Thyroperoxidase Ab SerPl-aCnc 0 - 34 IU/mL 18    Thyrotropin Receptor Ab 0.00 - 1.75 IU/L  0.65  <1.10    EXAM: THYROID  ULTRASOUND May 06, 2017.   TECHNIQUE: Ultrasound examination of the thyroid  gland and adjacent soft tissues was performed.   COMPARISON:  None.   FINDINGS: Parenchymal Echotexture: Moderately heterogenous   Isthmus: 0.6 cm   Right lobe: 6.0 cm x 2.2 cm x 2.6 cm   Left lobe: 5.6 cm x 2.3 cm x 2.3 cm   _________________________________________________________   Estimated total number of nodules >/= 1 cm: 4   Number of spongiform nodules >/=  2 cm not described below (TR1): 0   Number of mixed cystic and solid nodules >/= 1.5 cm not described below (TR2): 0   _________________________________________________________   Nodule # 1:   Location: Isthmus; Mid   Maximum size: 1.2 cm; Other 2 dimensions: 1.0 cm x 0.9 cm   Composition: mixed cystic and solid (1)   Echogenicity: isoechoic (1)   Shape: not taller-than-wide (0)   Margins: smooth (0)   Echogenic foci: none (0)   ACR TI-RADS total points: 2.   ACR TI-RADS risk category: TR2 (2 points).   ACR TI-RADS recommendations:   Nodule does not meet criteria for surveillance or biopsy   _________________________________________________________   Nodule # 2:   Location: Right; Superior   Maximum size: 0.8 cm; Other 2 dimensions: 0.7 cm x 0.4 cm   Composition: cannot determine (2)   Echogenicity: isoechoic (1)   Shape: not taller-than-wide (0)   Margins: ill-defined (0)   Echogenic foci: none (0)   ACR TI-RADS total points: 3.   ACR TI-RADS risk category: TR3 (3 points).   ACR TI-RADS recommendations:   Nodule does not meet criteria for surveillance or biopsy   _________________________________________________________   Nodule # 3:   Location: Right; Mid   Maximum size: 2.0 cm; Other 2 dimensions: 1.2 cm x 0.9 cm   Composition: mixed cystic and solid (1)   Echogenicity: isoechoic (1)   Shape: not taller-than-wide (0)   Margins: ill-defined (0)   Echogenic  foci: none (0)   ACR TI-RADS total points: 2.   ACR TI-RADS risk category: TR2 (2 points).   ACR TI-RADS recommendations:   Nodule does not meet criteria for surveillance or biopsy   _________________________________________________________   Nodule # 4:   Location: Right; Inferior   Maximum size: 1.1 cm; Other 2 dimensions: 1.0 cm x 0.8 cm   Composition: solid/almost completely solid (2)   Echogenicity: isoechoic (1)   Shape: not taller-than-wide (0)   Margins: ill-defined (0)   Echogenic foci: none (0)   ACR TI-RADS total points: 3.   ACR TI-RADS risk category: TR3 (3 points).   ACR TI-RADS recommendations:   Nodule does not meet criteria for surveillance or biopsy   _________________________________________________________   Nodule # 5:   Location: Right; Inferior   Maximum size: 0.7 cm; Other 2 dimensions: 0.6 cm x 0.6 cm   Composition: mixed cystic and solid (1)   Echogenicity: isoechoic (1)   Shape: not taller-than-wide (0)   Margins: ill-defined (0)   Echogenic foci: none (0)   ACR TI-RADS total points: 2.   ACR TI-RADS risk category: TR2 (2 points).   ACR TI-RADS recommendations:   Nodule does not meet criteria for surveillance or biopsy   _________________________________________________________   Nodule # 6:   Location: Left; Inferior   Maximum size: 1.6 cm; Other 2 dimensions: 1.2 cm x 1.4 cm   Composition: mixed cystic and solid (1)   Echogenicity: isoechoic (1)   Shape: not taller-than-wide (0)   Margins: ill-defined (0)   Echogenic foci: none (0)   ACR TI-RADS total points: 2.   ACR TI-RADS risk category: TR2 (2 points).   ACR TI-RADS recommendations:   Nodule does not meet criteria for surveillance or biopsy   _________________________________________________________   No adenopathy   IMPRESSION: No thyroid  nodule meets criteria for biopsy or surveillance, as designated by the newly established ACR TI-RADS  criteria.   ASSESSMENT / PLAN  1. Multinodular goiter    Patient is known  to have multinodular goiter from 2011.  She has thyromegaly.  She has some neck discomfort otherwise no significant neck compressive symptoms.  No family history of thyroid  cancer, no radiation exposure.  She has remained euthyroid for several hours without thyroid  medication.  -History of silent thyroiditis in pregnancy in 2018 however had taken propylthiouracil  was stopped after few months.  -Patient is known to have enlarged thyroid  at least from 2011.  -Ultrasound thyroid  in May 06, 2017 : Reviewed images, multinodular goiter with mostly mixed cystic/solid nodules /heterogeneous, mostly subcentimeter and up to 2 cm.  No worrisome features on thyroid  nodules.  No indication of biopsy based on ultrasound in 2018.  -Ultrasound thyroid  in November 2024 with relatively stable bilateral thyroid  nodules.  -These nodules can be monitored with serial ultrasound.  -No significant neck compressive symptoms.  No indication of thyroid  surgery at this time.  Plan: -Patient reports she has increased enlargement of her goiter, in the last few months. -Check ultrasound thyroid  to monitor thyroid  nodules. -No indication of thyroid  medication, she has normal thyroid  function test. -Annual thyroid  function test. -Annual endocrinology follow-up.  Diagnoses and all orders for this visit:  Multinodular goiter -     US  THYROID ; Future     DISPOSITION Follow up in clinic in 12 months suggested.  Labs prior to follow-up visit.  All questions answered and patient verbalized understanding of the plan.  Kamayah Pillay, MD [facility] Endocrinology [facility] [address] [place], [address] [place], [place] [address] Phone # [phone]  At least part of this note was generated using voice recognition software. Inadvertent word errors may have occurred, which were not recognized during the proofreading process.

## 2024-10-14 ENCOUNTER — Ambulatory Visit
Admission: RE | Admit: 2024-10-14 | Discharge: 2024-10-14 | Disposition: A | Discharge status: Home / Self Care | Source: Ambulatory Visit | Attending: Endocrinology | Admitting: Endocrinology

## 2024-10-14 DIAGNOSIS — E042 Nontoxic multinodular goiter: Secondary | ICD-10-CM

## 2024-10-18 ENCOUNTER — Ambulatory Visit: Payer: Self-pay | Admitting: Endocrinology

## 2025-09-28 ENCOUNTER — Other Ambulatory Visit

## 2025-10-02 ENCOUNTER — Ambulatory Visit: Admitting: Endocrinology
